# Patient Record
Sex: Male | Born: 1970
Health system: Southern US, Community
[De-identification: ages and names within clinical notes are randomized; demographics above are authoritative.]

## PROBLEM LIST (undated history)

## (undated) DIAGNOSIS — F32A Depression, unspecified: Secondary | ICD-10-CM

## (undated) DIAGNOSIS — F1910 Other psychoactive substance abuse, uncomplicated: Secondary | ICD-10-CM

## (undated) DIAGNOSIS — F329 Major depressive disorder, single episode, unspecified: Secondary | ICD-10-CM

## (undated) DIAGNOSIS — T7840XA Allergy, unspecified, initial encounter: Secondary | ICD-10-CM

## (undated) DIAGNOSIS — F419 Anxiety disorder, unspecified: Secondary | ICD-10-CM

## (undated) DIAGNOSIS — Z87442 Personal history of urinary calculi: Secondary | ICD-10-CM

## (undated) DIAGNOSIS — I10 Essential (primary) hypertension: Secondary | ICD-10-CM

## (undated) DIAGNOSIS — K219 Gastro-esophageal reflux disease without esophagitis: Secondary | ICD-10-CM

## (undated) DIAGNOSIS — D649 Anemia, unspecified: Secondary | ICD-10-CM

## (undated) HISTORY — DX: Major depressive disorder, single episode, unspecified: F32.9

## (undated) HISTORY — DX: Allergy, unspecified, initial encounter: T78.40XA

## (undated) HISTORY — DX: Essential (primary) hypertension: I10

## (undated) HISTORY — DX: Other psychoactive substance abuse, uncomplicated: F19.10

## (undated) HISTORY — DX: Anxiety disorder, unspecified: F41.9

## (undated) HISTORY — DX: Depression, unspecified: F32.A

---

## 2013-09-08 ENCOUNTER — Ambulatory Visit (INDEPENDENT_AMBULATORY_CARE_PROVIDER_SITE_OTHER): Payer: BC Managed Care – PPO | Admitting: Internal Medicine

## 2013-09-08 VITALS — BP 132/84 | HR 77 | Temp 97.9°F | Resp 18 | Ht 71.5 in | Wt 184.0 lb

## 2013-09-08 DIAGNOSIS — F17200 Nicotine dependence, unspecified, uncomplicated: Secondary | ICD-10-CM

## 2013-09-08 DIAGNOSIS — S0002XA Blister (nonthermal) of scalp, initial encounter: Secondary | ICD-10-CM

## 2013-09-08 DIAGNOSIS — G4700 Insomnia, unspecified: Secondary | ICD-10-CM

## 2013-09-08 DIAGNOSIS — S00521A Blister (nonthermal) of lip, initial encounter: Secondary | ICD-10-CM

## 2013-09-08 DIAGNOSIS — F32A Depression, unspecified: Secondary | ICD-10-CM

## 2013-09-08 DIAGNOSIS — F411 Generalized anxiety disorder: Secondary | ICD-10-CM

## 2013-09-08 DIAGNOSIS — S0082XA Blister (nonthermal) of other part of head, initial encounter: Secondary | ICD-10-CM

## 2013-09-08 DIAGNOSIS — S1092XA Blister (nonthermal) of unspecified part of neck, initial encounter: Secondary | ICD-10-CM

## 2013-09-08 DIAGNOSIS — F329 Major depressive disorder, single episode, unspecified: Secondary | ICD-10-CM

## 2013-09-08 DIAGNOSIS — F3289 Other specified depressive episodes: Secondary | ICD-10-CM

## 2013-09-08 DIAGNOSIS — K130 Diseases of lips: Secondary | ICD-10-CM

## 2013-09-08 DIAGNOSIS — F419 Anxiety disorder, unspecified: Secondary | ICD-10-CM

## 2013-09-08 MED ORDER — VALACYCLOVIR HCL 1 G PO TABS: 1000.0000 mg | ORAL_TABLET | Freq: Two times a day (BID) | ORAL | Status: DC

## 2013-09-08 MED ORDER — TRAZODONE HCL 50 MG PO TABS: 50.0000 mg | ORAL_TABLET | Freq: Every evening | ORAL | Status: DC | PRN

## 2013-09-08 MED ORDER — FLUOXETINE HCL 20 MG PO TABS: 20.0000 mg | ORAL_TABLET | Freq: Every day | ORAL | Status: DC

## 2013-09-08 NOTE — Progress Notes (Signed)
   Subjective:    Patient ID: Kyle Allen, male    DOB: 07/12/1970, 43 y.o.   MRN: [id number]  HPI    Review of Systems     Objective:   Physical Exam        Assessment & Plan:

## 2013-09-08 NOTE — Patient Instructions (Addendum)
Smoking Cessation Quitting smoking is important to your health and has many advantages. However, it is not always easy to quit since nicotine is a very addictive drug. Oftentimes, people try 3 times or more before being able to quit. This document explains the best ways for you to prepare to quit smoking. Quitting takes hard work and a lot of effort, but you can do it. ADVANTAGES OF QUITTING SMOKING  You will live longer, feel better, and live better.  Your body will feel the impact of quitting smoking almost immediately.  Within 20 minutes, blood pressure decreases. Your pulse returns to its normal level.  After 8 hours, carbon monoxide levels in the blood return to normal. Your oxygen level increases.  After 24 hours, the chance of having a heart attack starts to decrease. Your breath, hair, and body stop smelling like smoke.  After 48 hours, damaged nerve endings begin to recover. Your sense of taste and smell improve.  After 72 hours, the body is virtually free of nicotine. Your bronchial tubes relax and breathing becomes easier.  After 2 to 12 weeks, lungs can hold more air. Exercise becomes easier and circulation improves.  The risk of having a heart attack, stroke, cancer, or lung disease is greatly reduced.  After 1 year, the risk of coronary heart disease is cut in half.  After 5 years, the risk of stroke falls to the same as a nonsmoker.  After 10 years, the risk of lung cancer is cut in half and the risk of other cancers decreases significantly.  After 15 years, the risk of coronary heart disease drops, usually to the level of a nonsmoker.  If you are pregnant, quitting smoking will improve your chances of having a healthy baby.  The people you live with, especially any children, will be healthier.  You will have extra money to spend on things other than cigarettes. QUESTIONS TO THINK ABOUT BEFORE ATTEMPTING TO QUIT You may want to talk about your answers with your  health care provider.  Why do you want to quit?  If you tried to quit in the past, what helped and what did not?  What will be the most difficult situations for you after you quit? How will you plan to handle them?  Who can help you through the tough times? Your family? Friends? A health care provider?  What pleasures do you get from smoking? What ways can you still get pleasure if you quit? Here are some questions to ask your health care provider:  How can you help me to be successful at quitting?  What medicine do you think would be best for me and how should I take it?  What should I do if I need more help?  What is smoking withdrawal like? How can I get information on withdrawal? GET READY  Set a quit date.  Change your environment by getting rid of all cigarettes, ashtrays, matches, and lighters in your home, car, or work. Do not let people smoke in your home.  Review your past attempts to quit. Think about what worked and what did not. GET SUPPORT AND ENCOURAGEMENT You have a better chance of being successful if you have help. You can get support in many ways.  Tell your family, friends, and coworkers that you are going to quit and need their support. Ask them not to smoke around you.  Get individual, group, or telephone counseling and support. Programs are available at local hospitals and health centers. Call   your local health department for information about programs in your area.  Spiritual beliefs and practices may help some smokers quit.  Download a "quit meter" on your computer to keep track of quit statistics, such as how long you have gone without smoking, cigarettes not smoked, and money saved.  Get a self-help book about quitting smoking and staying off tobacco. LEARN NEW SKILLS AND BEHAVIORS  Distract yourself from urges to smoke. Talk to someone, go for a walk, or occupy your time with a task.  Change your normal routine. Take a different route to work.  Drink tea instead of coffee. Eat breakfast in a different place.  Reduce your stress. Take a hot bath, exercise, or read a book.  Plan something enjoyable to do every day. Reward yourself for not smoking.  Explore interactive web-based programs that specialize in helping you quit. GET MEDICINE AND USE IT CORRECTLY Medicines can help you stop smoking and decrease the urge to smoke. Combining medicine with the above behavioral methods and support can greatly increase your chances of successfully quitting smoking.  Nicotine replacement therapy helps deliver nicotine to your body without the negative effects and risks of smoking. Nicotine replacement therapy includes nicotine gum, lozenges, inhalers, nasal sprays, and skin patches. Some may be available over-the-counter and others require a prescription.  Antidepressant medicine helps people abstain from smoking, but how this works is unknown. This medicine is available by prescription.  Nicotinic receptor partial agonist medicine simulates the effect of nicotine in your brain. This medicine is available by prescription. Ask your health care provider for advice about which medicines to use and how to use them based on your health history. Your health care provider will tell you what side effects to look out for if you choose to be on a medicine or therapy. Carefully read the information on the package. Do not use any other product containing nicotine while using a nicotine replacement product.  RELAPSE OR DIFFICULT SITUATIONS Most relapses occur within the first 3 months after quitting. Do not be discouraged if you start smoking again. Remember, most people try several times before finally quitting. You may have symptoms of withdrawal because your body is used to nicotine. You may crave cigarettes, be irritable, feel very hungry, cough often, get headaches, or have difficulty concentrating. The withdrawal symptoms are only temporary. They are strongest  when you first quit, but they will go away within 10-14 days. To reduce the chances of relapse, try to:  Avoid drinking alcohol. Drinking lowers your chances of successfully quitting.  Reduce the amount of caffeine you consume. Once you quit smoking, the amount of caffeine in your body increases and can give you symptoms, such as a rapid heartbeat, sweating, and anxiety.  Avoid smokers because they can make you want to smoke.  Do not let weight gain distract you. Many smokers will gain weight when they quit, usually less than 10 pounds. Eat a healthy diet and stay active. You can always lose the weight gained after you quit.  Find ways to improve your mood other than smoking. FOR MORE INFORMATION  www.smokefree.gov  Document Released: 12/12/2000 Document Revised: 05/04/2013 Document Reviewed: 03/29/2011 [facility] Patient Information 2015 [place], [place]. This information is not intended to replace advice given to you by your health care provider. Make sure you discuss any questions you have with your health care provider. Cold Sore A cold sore (fever blister) is a skin infection caused by the herpes simplex virus (HSV-1). HSV-1 is closely related  to the virus that causes genital herpes (HSV-2), but they are not the same even though both viruses can cause oral and genital infections. Cold sores are small, fluid-filled sores inside of the mouth or on the lips, gums, nose, chin, cheeks, or fingers.  The herpes simplex virus can be easily passed (contagious) to other people through close personal contact, such as kissing or sharing personal items. The virus can also spread to other parts of the body, such as the eyes or genitals. Cold sores are contagious until the sores crust over completely. They often heal within 2 weeks.  Once a person is infected, the herpes simplex virus remains permanently in the body. Therefore, there is no cure for cold sores, and they often recur when a person is tired,  stressed, sick, or gets too much sun. Additional factors that can cause a recurrence include hormone changes in menstruation or pregnancy, certain drugs, and cold weather.  CAUSES  Cold sores are caused by the herpes simplex virus. The virus is spread from person to person through close contact, such as through kissing, touching the affected area, or sharing personal items such as lip balm, razors, or eating utensils.  SYMPTOMS  The first infection may not cause symptoms. If symptoms develop, the symptoms often go through different stages. Here is how a cold sore develops:   Tingling, itching, or burning is felt 1-2 days before the outbreak.   Fluid-filled blisters appear on the lips, inside the mouth, nose, or on the cheeks.   The blisters start to ooze clear fluid.   The blisters dry up and a yellow crust appears in its place.   The crust falls off.  Symptoms depend on whether it is the initial outbreak or a recurrence. Some other symptoms with the first outbreak may include:   Fever.   Sore throat.   Headache.   Muscle aches.   Swollen neck glands.  DIAGNOSIS  A diagnosis is often made based on your symptoms and looking at the sores. Sometimes, a sore may be swabbed and then examined in the lab to make a final diagnosis. If the sores are not present, blood tests can find the herpes simplex virus.  TREATMENT  There is no cure for cold sores and no vaccine for the herpes simplex virus. Within 2 weeks, most cold sores go away on their own without treatment. Medicines cannot make the infection go away, but medicine can help relieve some of the pain associated with the sores, can work to stop the virus from multiplying, and can also shorten healing time. Medicine may be in the form of creams, gels, pills, or a shot.  HOME CARE INSTRUCTIONS   Only take over-the-counter or prescription medicines for pain, discomfort, or fever as directed by your caregiver. Do not use aspirin.    Use a cotton-tip swab to apply creams or gels to your sores.   Do not touch the sores or pick the scabs. Wash your hands often. Do not touch your eyes without washing your hands first.   Avoid kissing, oral sex, and sharing personal items until sores heal.   Apply an ice pack on your sores for 10-15 minutes to ease any discomfort.   Avoid hot, cold, or salty foods because they may hurt your mouth. Eat a soft, bland diet to avoid irritating the sores. Use a straw to drink if you have pain when drinking out of a glass.   Keep sores clean and dry to prevent an infection  of other tissues.   Avoid the sun and limit stress if these things trigger outbreaks. If sun causes cold sores, apply sunscreen on the lips before being out in the sun.  SEEK MEDICAL CARE IF:   You have a fever or persistent symptoms for more than 2-3 days.   You have a fever and your symptoms suddenly get worse.   You have pus, not clear fluid, coming from the sores.   You have redness that is spreading.   You have pain or irritation in your eye.   You get sores on your genitals.   Your sores do not heal within 2 weeks.   You have a weakened immune system.   You have frequent recurrences of cold sores.  MAKE SURE YOU:   Understand these instructions.  Will watch your condition.  Will get help right away if you are not doing well or get worse. Document Released: 12/16/1999 Document Revised: 05/04/2013 Document Reviewed: 05/02/2011 [facility] Patient Information 2015 [place], [place]. This information is not intended to replace advice given to you by your health care provider. Make sure you discuss any questions you have with your health care provider.

## 2013-09-08 NOTE — Progress Notes (Signed)
   Subjective:    Patient ID: Kyle Allen, male    DOB: 11-Apr-1970, 44 y.o.   MRN: [id number]  HPI Kyle Allen is a 43 y.o. Male patient for medication refills and for an exam for a blister on his lower lip. He needs refills for the Prozac  and Trazodone . He currently takes Prozac for depression and anxiety. He also takes Trazodone for insomnia. In April, he was at [facility] for treatment for alcoholism and drug dependence. He has been sober and clean for the past 5 months. The mentioned medications above were given to him while he was in the treatment center.   He is also here for a blister on his lower lip. He states that that blister hurts the most at night. He does not get any relief from different lip balms. He thinks it may be a sunburn. He is  A smoker and the bister present for one month. No past dx of fever blister but gets this blister every summer when in the sun.   [facility].    Review of Systems     Objective:   Physical Exam  Vitals reviewed. Constitutional: He is oriented to person, place, and time. He appears well-developed and well-nourished. No distress.  HENT:  Head: Normocephalic and atraumatic.  Mouth/Throat: Oral lesions present. No uvula swelling.    2 intact blisters Will attempt culture.  Eyes: EOM are normal.  Neck: Normal range of motion.  Cardiovascular: Normal rate, regular rhythm and normal heart sounds.   Pulmonary/Chest: Effort normal and breath sounds normal.  Neurological: He is alert and oriented to person, place, and time. He exhibits normal muscle tone. Coordination normal.  Skin: Rash noted.  Psychiatric: He has a normal mood and affect. His speech is normal and behavior is normal. Judgment and thought content normal. Cognition and memory are normal.          Assessment & Plan:  Depression/anxiety/recovering substance abuse Refill prozac and trazadone Culture blister/there is a concern  this could be skin cancer. Valcyclovir trial

## 2013-09-11 LAB — HERPES SIMPLEX VIRUS CULTURE: ORGANISM ID, BACTERIA: NOT DETECTED

## 2013-09-14 ENCOUNTER — Encounter: Payer: Self-pay | Admitting: Radiology

## 2013-09-16 ENCOUNTER — Encounter: Payer: Self-pay | Admitting: Family Medicine

## 2013-09-16 ENCOUNTER — Ambulatory Visit (INDEPENDENT_AMBULATORY_CARE_PROVIDER_SITE_OTHER): Payer: BC Managed Care – PPO | Admitting: Family Medicine

## 2013-09-16 VITALS — BP 134/76 | HR 81 | Temp 98.9°F | Resp 16 | Ht 72.0 in | Wt 183.2 lb

## 2013-09-16 DIAGNOSIS — F1911 Other psychoactive substance abuse, in remission: Secondary | ICD-10-CM

## 2013-09-16 DIAGNOSIS — F329 Major depressive disorder, single episode, unspecified: Secondary | ICD-10-CM

## 2013-09-16 DIAGNOSIS — F32A Depression, unspecified: Secondary | ICD-10-CM

## 2013-09-16 DIAGNOSIS — F341 Dysthymic disorder: Secondary | ICD-10-CM

## 2013-09-16 DIAGNOSIS — B001 Herpesviral vesicular dermatitis: Secondary | ICD-10-CM

## 2013-09-16 DIAGNOSIS — B009 Herpesviral infection, unspecified: Secondary | ICD-10-CM

## 2013-09-16 DIAGNOSIS — F419 Anxiety disorder, unspecified: Principal | ICD-10-CM

## 2013-09-16 DIAGNOSIS — Z720 Tobacco use: Secondary | ICD-10-CM

## 2013-09-16 DIAGNOSIS — Z23 Encounter for immunization: Secondary | ICD-10-CM

## 2013-09-16 DIAGNOSIS — G4700 Insomnia, unspecified: Secondary | ICD-10-CM

## 2013-09-16 DIAGNOSIS — F17200 Nicotine dependence, unspecified, uncomplicated: Secondary | ICD-10-CM

## 2013-09-16 NOTE — Patient Instructions (Signed)
1. Please call me in 2 weeks if your lip has not completely healed.

## 2013-09-16 NOTE — Progress Notes (Signed)
Subjective:    Patient ID: Kyle Allen, male    DOB: 12/09/1970, 43 y.o.   MRN: [id number]  This chart was scribed for Ethelda Chick, MD by Tonye Royalty, ED Scribe. This patient was seen in room 21 and the patient's care was started at 11:08 AM.   09/16/2013  Establish Care, Anxiety, Addiction Problem and Insomnia   HPI  HPI Comments: Kyle Allen is a 43 y.o. male who presents to [facility] to become established as a patient. He saw Dr. Perrin Maltese last week for refill of his antidepressants. He is maintained on Prozac  daily for anxiety and depression, he also takes Trazodone for insomnia. He has a history of polysubstance abuse and was in [facility] in April through July and has been sober since then.  He also saw Dr. Perrin Maltese for a blister on his lip that had been present for 1 month, he did a herpes culture that was negative. He had a cholesterol test done on 04/03/2013 that measured 228 total cholesterol, LDL 117, and HDL 86. Dr. Perrin Maltese prescribed Valtrex for his lip ulcer. He was recommended to get in to live at the [facility] after discharge from [facility].  He states his blister is much improved at this time but states he still wakes up with blood on his lip. He states he stopped applying chap stick or other products to the area and reports taking Valtrex for 1 week.Marland Kitchen He states he has a blister to that area every summer but states it has not lasted as long previously.    He states he did the 90 day program at [facility], having moved to [place] from [place] for this purpose, and now lives at [facility].   He states his last physical was in 2009 or 2010, last tetanus is unknown, last eye exam was some time ago but uses a pair of reading glasses from [employer], and last saw the dentist in April.   He reports seasonal allergies and history of anxiety and depression. He states daily Prozac makes him feel better though he knows it is a low dose.  He reports smoking one pack a day. He denies other significant medical history, past surgeries, or hospitalization for reasons unrelated polysubstance abuse.   He states his mother is 56 with hypertension and cholesterol without heart attack, stroke, or alcohol abuse; his father is 108 without history of alcohol abuse; he reports his grandparents had history of alcohol abuse. He states he is single and has never been married or had children. 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 He states he did drink a fifth a day for over 20 years; he reports 5 DWIs but states he can get his license back September 23. He reports previously abusing marijuana and prescription pain medications but states alcohol was his main addiction. He states he goes to the gym 3 times a week and does cardio and weight lifting; he states he has gained significant weight since going to [facility].   After getting out of [facility] he was referred to psychiatry and saw Dr. Tomasa Rand who prescribed him with bipolar disorder and recommended many anti-psychotic medications. He states he meets with an extended recovery group from [facility] and will for 12 months; he states his counselors did not approve of the psychiatrist's recommendations and he does not plan to see him again. He states his  anxiety is under control.   He denies chest pain, shortness of breath, GI problems (he states he takes Prilosec every day), or black or bloody stools.   Review of Systems  Constitutional: Negative for fever, chills, diaphoresis, activity change, appetite change and fatigue.  Eyes: Negative for visual disturbance.  Respiratory: Negative for cough and shortness of breath.   Cardiovascular: Negative for chest pain, palpitations and leg swelling.  Gastrointestinal: Negative for nausea, vomiting, abdominal pain and blood in stool.  Endocrine: Negative for cold intolerance, heat  intolerance, polydipsia, polyphagia and polyuria.  Skin: Positive for wound.  Neurological: Negative for dizziness, tremors, seizures, syncope, facial asymmetry, speech difficulty, weakness, light-headedness, numbness and headaches.  Psychiatric/Behavioral: Negative for suicidal ideas, sleep disturbance, self-injury and dysphoric mood. The patient is not nervous/anxious (states he is better than he has been in a long time).        States he is emotionally well    Past Medical History  Diagnosis Date  . Allergy   . Anxiety   . Depression   . Substance abuse    No past surgical history on file. No Known Allergies Current Outpatient Prescriptions  Medication Sig Dispense Refill  . FLUoxetine (PROZAC) 20 MG tablet Take 1 tablet (20 mg total) by mouth daily.  30 tablet  3  . traZODone (DESYREL) 50 MG tablet Take 1 tablet (50 mg total) by mouth at bedtime as needed for sleep.  30 tablet  3  . valACYclovir (VALTREX) 1000 MG tablet Take 1 tablet (1,000 mg total) by mouth 2 (two) times daily.  14 tablet  1   No current facility-administered medications for this visit.   History   Social History  . Marital Status: Single    Spouse Name: N/A    Number of Children: N/A  . Years of Education: N/A   Occupational History  . Not on file.   Social History Main Topics  . Smoking status: Current Every Day Smoker -- 1.00 packs/day for 20 years    Types: Cigarettes  . Smokeless tobacco: Not on file  . Alcohol Use: No  . Drug Use: No  . Sexual Activity: Not on file   Other Topics Concern  . Not on file   Social History Narrative   Marital status: single; not dating.  From [place], [place].       Children:  None       Lives: at [facility] since 07/2013.  Group home of 8 men in recovery.      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      Tobacco:  1 ppd x 20 years.  Never quit.      Alcohol: in recovery; drinking 1/5 per day;  duration 20 years; DWIs x 5; last DWI 09/2009.  Gets license back this month; four years without license.      Drugs:  Marijuana, prescription pills (pain medications Oxycontin, Vicodin, some benzos).  No heroine or cocaine.      Exercise:  Six months; joined [employer]; cardio to start; free weights   Family History  Problem Relation Age of Onset  . Hyperlipidemia Mother   . Hypertension Mother   . Hyperlipidemia Father   . Hypertension Father   . Hyperlipidemia Brother   . Hypertension Brother   . Cancer Maternal Grandmother   . Hyperlipidemia Maternal Grandmother   . Hypertension Maternal Grandmother   . Hyperlipidemia Maternal Grandfather   . Hypertension Maternal Grandfather   .  Diabetes Maternal Grandfather   . Hyperlipidemia Paternal Grandmother   . Hypertension Paternal Grandmother   . Hyperlipidemia Paternal Grandfather   . Hypertension Paternal Grandfather   . Heart disease Paternal Grandfather        Objective:    BP 134/76  Pulse 81  Temp(Src) 98.9 F (37.2 C) (Oral)  Resp 16  Ht 6' (1.829 m)  Wt 183 lb 3.2 oz (83.099 kg)  BMI 24.84 kg/m2  SpO2 97% Physical Exam  Nursing note and vitals reviewed. Constitutional: He is oriented to person, place, and time. He appears well-developed and well-nourished. No distress.  HENT:  Head: Normocephalic and atraumatic.  Right Ear: External ear normal.  Left Ear: External ear normal.  Nose: Nose normal.  Mouth/Throat: Oropharynx is clear and moist.    Healing eschar on lower lip  Eyes: Conjunctivae and EOM are normal. Pupils are equal, round, and reactive to light.  Neck: Normal range of motion. Neck supple. Carotid bruit is not present. No thyromegaly present.  Cardiovascular: Normal rate, regular rhythm, normal heart sounds and intact distal pulses.  Exam reveals no gallop and no friction rub.   No murmur heard. Pulmonary/Chest: Effort normal and breath sounds normal. No respiratory distress. He has no wheezes.  He has no rales.  Abdominal: Soft. Bowel sounds are normal. He exhibits no distension and no mass. There is no tenderness. There is no rebound and no guarding.  Musculoskeletal: Normal range of motion.  Lymphadenopathy:    He has no cervical adenopathy.  Neurological: He is alert and oriented to person, place, and time. No cranial nerve deficit. He exhibits normal muscle tone. Coordination normal.  Normal strength to his arms  Skin: Skin is warm and dry. No rash noted. He is not diaphoretic.  Psychiatric: He has a normal mood and affect. His behavior is normal. Judgment and thought content normal.   Results for orders placed in visit on 09/08/13  HERPES SIMPLEX VIRUS CULTURE      Result Value Ref Range   Organism ID, Bacteria No Herpes Simplex Virus detected.         Assessment & Plan:   1. Anxiety and depression   2. Insomnia   3. Tobacco abuse   4. Substance abuse in remission   5. Need for prophylactic vaccination and inoculation against influenza   6. Need for prophylactic vaccination with combined diphtheria-tetanus-pertussis (DTP) vaccine   7. Herpes labialis     1. Anxiety and depression: controlled with Prozac  daily; follow up in three months due to recent transition from [facility] to [facility].  Doing well currently; continue weekly group counseling at [facility]. 2.  Insomnia: controlled with Trazodone; refill provided. 3.  Tobacco abuse: persistent; agree with focusing on cessation in upcoming year. 4.  Substance abuse in remission: Stable; alcohol and narcotic prescription drugs; DWIs x 5 in past; sober for six months; to [employer] in October.  Attending weekly group sessions; attending AA meetings twice daily currently; applying for a job currently.  Family in [place] and advised NOT to return to [place] per  [facility]. 5.  S/p TDAP 6. S/p influenza vaccine. 7. Herpes Labialis: improving; s/p Valtrex for one week.  If wound not completely  healed in two weeks, call office for dermatology evaluation and bx.   No orders of the defined types were placed in this encounter.    Return in about 3 months (around 12/16/2013) for recheck.   I personally performed the services described  in this documentation, which was scribed in my presence.  The recorded information has been reviewed and is accurate.   Nilda Simmer, M.D.  [facility] [address] [place], [place]  [address] [phone] phone [phone] fax

## 2013-09-18 DIAGNOSIS — F17200 Nicotine dependence, unspecified, uncomplicated: Secondary | ICD-10-CM | POA: Insufficient documentation

## 2013-09-18 DIAGNOSIS — F32A Depression, unspecified: Secondary | ICD-10-CM | POA: Insufficient documentation

## 2013-09-18 DIAGNOSIS — G4700 Insomnia, unspecified: Secondary | ICD-10-CM | POA: Insufficient documentation

## 2013-09-18 DIAGNOSIS — F1911 Other psychoactive substance abuse, in remission: Secondary | ICD-10-CM | POA: Insufficient documentation

## 2013-09-18 DIAGNOSIS — F419 Anxiety disorder, unspecified: Principal | ICD-10-CM

## 2013-09-18 DIAGNOSIS — B001 Herpesviral vesicular dermatitis: Secondary | ICD-10-CM | POA: Insufficient documentation

## 2013-09-18 DIAGNOSIS — F329 Major depressive disorder, single episode, unspecified: Secondary | ICD-10-CM | POA: Insufficient documentation

## 2013-12-16 ENCOUNTER — Ambulatory Visit: Payer: BC Managed Care – PPO | Admitting: Family Medicine

## 2014-01-04 ENCOUNTER — Ambulatory Visit (INDEPENDENT_AMBULATORY_CARE_PROVIDER_SITE_OTHER): Payer: BLUE CROSS/BLUE SHIELD | Admitting: Family Medicine

## 2014-01-04 ENCOUNTER — Encounter: Payer: Self-pay | Admitting: Family Medicine

## 2014-01-04 VITALS — BP 144/94 | HR 88 | Temp 98.4°F | Resp 16 | Ht 71.5 in | Wt 184.2 lb

## 2014-01-04 DIAGNOSIS — G4700 Insomnia, unspecified: Secondary | ICD-10-CM | POA: Diagnosis not present

## 2014-01-04 DIAGNOSIS — S00521A Blister (nonthermal) of lip, initial encounter: Secondary | ICD-10-CM

## 2014-01-04 DIAGNOSIS — F419 Anxiety disorder, unspecified: Secondary | ICD-10-CM

## 2014-01-04 DIAGNOSIS — F418 Other specified anxiety disorders: Secondary | ICD-10-CM | POA: Diagnosis not present

## 2014-01-04 DIAGNOSIS — F32A Depression, unspecified: Secondary | ICD-10-CM

## 2014-01-04 DIAGNOSIS — Z720 Tobacco use: Secondary | ICD-10-CM

## 2014-01-04 DIAGNOSIS — F1910 Other psychoactive substance abuse, uncomplicated: Secondary | ICD-10-CM | POA: Diagnosis not present

## 2014-01-04 DIAGNOSIS — K130 Diseases of lips: Secondary | ICD-10-CM

## 2014-01-04 DIAGNOSIS — F329 Major depressive disorder, single episode, unspecified: Secondary | ICD-10-CM | POA: Diagnosis not present

## 2014-01-04 DIAGNOSIS — F17200 Nicotine dependence, unspecified, uncomplicated: Secondary | ICD-10-CM

## 2014-01-04 DIAGNOSIS — F1911 Other psychoactive substance abuse, in remission: Secondary | ICD-10-CM

## 2014-01-04 MED ORDER — FLUOXETINE HCL 20 MG PO TABS: 20.0000 mg | ORAL_TABLET | Freq: Every day | ORAL | Status: DC

## 2014-01-04 NOTE — Patient Instructions (Signed)
Congratulations on all the steps you've made!  We've refilled your prozac for 3 months. Please let us know if you feel like the anxiety is continuing to bother you over the course of this month and we will have you increase your dose to 40 mg every day. Otherwise we'll leave it as is.  We'll plan to see you back in 3 months.

## 2014-01-04 NOTE — Progress Notes (Signed)
Subjective:    Patient ID: Kyle Allen, male    DOB: 1970/10/25, 44 y.o.   MRN: [id number]  PCP: Reginia Forts, MD  Chief Complaint  Patient presents with  . Medication Refill    PROZAC and TRAZODONE   Patient Active Problem List   Diagnosis Date Noted  . Anxiety and depression 09/18/2013  . Insomnia 09/18/2013  . Tobacco abuse 09/18/2013  . Substance abuse in remission 09/18/2013  . Herpes labialis 09/18/2013   Prior to Admission medications   Medication Sig Start Date End Date Taking? Authorizing Provider  FLUoxetine (PROZAC) 20 MG tablet Take 1 tablet (20 mg total) by mouth daily. 09/08/13  Yes Orma Flaming, MD  traZODone (DESYREL) 50 MG tablet Take 1 tablet (50 mg total) by mouth at bedtime as needed for sleep. 09/08/13  Yes Orma Flaming, MD  valACYclovir (VALTREX) 1000 MG tablet Take 1 tablet (1,000 mg total) by mouth 2 (two) times daily. Patient not taking: Reported on 01/04/2014 09/08/13   Orma Flaming, MD   Medications, allergies, past medical history, surgical history, family history, social history and problem list reviewed and updated.  HPI  39 yom with pmh substance abuse, depression, anxiety presents for 3 month f/u visit.  Doing well since last visit. Has moved out of [facility] and into [facility]. He has his own room as of past couple months. He got his license back, bought a car, and has been working part-time in Theatre manager at [employer].   He went back to [facility] for the first time in 8 months over the holidays to see his family. Reall enjoyed his visit. He mentions he has been feeling slightly anxious the past five weeks. He attributes this to his trip to [place] and the possibility that he would see his old friends. He doesn't think the anxiety is affecting his work at all. His sponsor is aware of this and is not concerned. He continues to go to daily [employer]. Has been sober 9 months.   Taking his prozac 20 mg qd. Taking  trazodone 50 mg for sleep approx 3 nights per week. He has graduated from the weekly outpatient psychotherapy group sessions at [facility]; those weekly sessions ended in November 2015.  He talks to his sponsor daily and is working his steps.  Continues to suffer with frequent using dreams which are quite overwhelming and frightening to patient.   When he was seen here in 9/15 he had a blister on his lip, this resolved with valtrex and has not come back.   Review of Systems  Constitutional: Negative for fever, chills, diaphoresis and fatigue.  Skin: Negative for rash and wound.  Psychiatric/Behavioral: Positive for sleep disturbance. Negative for suicidal ideas, self-injury, dysphoric mood and decreased concentration. The patient is nervous/anxious.    No CP, SOB, fever, chills.     Objective:   Physical Exam  Constitutional: He is oriented to person, place, and time. He appears well-developed and well-nourished.  Non-toxic appearance. He does not have a sickly appearance. He does not appear ill. No distress.  BP 144/94 mmHg  Pulse 88  Temp(Src) 98.4 F (36.9 C) (Oral)  Resp 16  Ht 5' 11.5" (1.816 m)  Wt 184 lb 3.2 oz (83.553 kg)  BMI 25.34 kg/m2  SpO2 97%   HENT:  Head: Normocephalic and atraumatic.  Eyes: Conjunctivae and EOM are normal. Pupils are equal, round, and reactive to light.  Neck: Normal range of motion. Neck supple. No  thyromegaly present.  Cardiovascular: Normal rate, regular rhythm and normal heart sounds.   No murmur heard. Pulmonary/Chest: Breath sounds normal. He has no wheezes.  Lymphadenopathy:    He has no cervical adenopathy.  Neurological: He is alert and oriented to person, place, and time. No cranial nerve deficit. Coordination normal.  No tremor.  Skin: No rash noted. He is not diaphoretic.  Psychiatric: He has a normal mood and affect. His speech is normal and behavior is normal.      Assessment & Plan:   89 yom with pmh substance abuse,  depression, anxiety presents for 3 month f/u visit.  Anxiety and depression - Plan: FLUoxetine (PROZAC) 20 MG tablet --Doing well, in good spirits in clinic today --Continue prozac 20 mg qd --continue trazodone at night as needed --Has had slightly increased anxiety lately most likely due to recent holiday visit. If anxiety continues over this month can increase dose to 40 mg qd. Pt to call if desires this. --rtc 6 weeks for f/u -continues in remission/recovery; met nine month mark of recovery.    Julieta Gutting, PA-C Physician Assistant-Certified [facility]  Norwood Levo, M.D. [facility] [place] [address] [place], [place]  [address] [phone] phone [phone] fax   01/04/2014 2:40 PM

## 2014-01-25 ENCOUNTER — Telehealth: Payer: Self-pay

## 2014-01-25 DIAGNOSIS — F32A Depression, unspecified: Secondary | ICD-10-CM

## 2014-01-25 DIAGNOSIS — F419 Anxiety disorder, unspecified: Principal | ICD-10-CM

## 2014-01-25 DIAGNOSIS — F329 Major depressive disorder, single episode, unspecified: Secondary | ICD-10-CM

## 2014-01-25 MED ORDER — FLUOXETINE HCL 20 MG PO TABS: 40.0000 mg | ORAL_TABLET | Freq: Every day | ORAL | Status: DC

## 2014-01-25 NOTE — Telephone Encounter (Signed)
Sent script to pharmacy- Per OV pt was to increase to 40mg  daily.

## 2014-01-25 NOTE — Telephone Encounter (Signed)
Pt states Dr. Katrinka BlazingSmith wanted him to double up on his PROZAC 20mg s, and take 40mg s but he is totally out now had gone to the pharmacy but wasn't able to get refills unless the Dr. Call [employer] to authorize Getting a refill before February . Please call pt at [phone]   [facility] [address] [facility]

## 2014-01-26 ENCOUNTER — Telehealth: Payer: Self-pay

## 2014-01-26 NOTE — Telephone Encounter (Signed)
Pt states he is in dire need of his PROZAC 20mg s. Please call [phone]

## 2014-01-26 NOTE — Telephone Encounter (Signed)
Check w/pharm to make sure Rx went through w/out problem. Notified pt ready at pharm for p/up.

## 2014-02-15 ENCOUNTER — Ambulatory Visit (INDEPENDENT_AMBULATORY_CARE_PROVIDER_SITE_OTHER): Payer: BLUE CROSS/BLUE SHIELD | Admitting: Family Medicine

## 2014-02-15 ENCOUNTER — Encounter: Payer: Self-pay | Admitting: Family Medicine

## 2014-02-15 VITALS — BP 172/94 | HR 84 | Temp 98.8°F | Resp 16 | Ht 71.5 in | Wt 185.0 lb

## 2014-02-15 DIAGNOSIS — R030 Elevated blood-pressure reading, without diagnosis of hypertension: Secondary | ICD-10-CM

## 2014-02-15 DIAGNOSIS — F1910 Other psychoactive substance abuse, uncomplicated: Secondary | ICD-10-CM

## 2014-02-15 DIAGNOSIS — F1911 Other psychoactive substance abuse, in remission: Secondary | ICD-10-CM

## 2014-02-15 DIAGNOSIS — F411 Generalized anxiety disorder: Secondary | ICD-10-CM

## 2014-02-15 DIAGNOSIS — IMO0001 Reserved for inherently not codable concepts without codable children: Secondary | ICD-10-CM

## 2014-02-15 LAB — CBC WITH DIFFERENTIAL/PLATELET
Basophils Absolute: 0 10*3/uL (ref 0.0–0.1)
Basophils Relative: 0 % (ref 0–1)
Eosinophils Absolute: 0.4 10*3/uL (ref 0.0–0.7)
Eosinophils Relative: 4 % (ref 0–5)
HCT: 42.8 % (ref 39.0–52.0)
Hemoglobin: 15 g/dL (ref 13.0–17.0)
LYMPHS ABS: 2.9 10*3/uL (ref 0.7–4.0)
LYMPHS PCT: 28 % (ref 12–46)
MCH: 33.5 pg (ref 26.0–34.0)
MCHC: 35 g/dL (ref 30.0–36.0)
MCV: 95.5 fL (ref 78.0–100.0)
MONOS PCT: 6 % (ref 3–12)
MPV: 9 fL (ref 8.6–12.4)
Monocytes Absolute: 0.6 10*3/uL (ref 0.1–1.0)
NEUTROS ABS: 6.5 10*3/uL (ref 1.7–7.7)
Neutrophils Relative %: 62 % (ref 43–77)
Platelets: 287 10*3/uL (ref 150–400)
RBC: 4.48 MIL/uL (ref 4.22–5.81)
RDW: 12.8 % (ref 11.5–15.5)
WBC: 10.5 10*3/uL (ref 4.0–10.5)

## 2014-02-15 LAB — POCT URINALYSIS DIPSTICK
Bilirubin, UA: NEGATIVE
Blood, UA: NEGATIVE
Glucose, UA: NEGATIVE
Ketones, UA: NEGATIVE
Leukocytes, UA: NEGATIVE
Nitrite, UA: NEGATIVE
PH UA: 5.5
Protein, UA: NEGATIVE
Spec Grav, UA: 1.01
UROBILINOGEN UA: 0.2

## 2014-02-15 LAB — TSH: TSH: 1.46 u[IU]/mL (ref 0.350–4.500)

## 2014-02-15 LAB — COMPREHENSIVE METABOLIC PANEL
ALBUMIN: 4.9 g/dL (ref 3.5–5.2)
ALK PHOS: 36 U/L — AB (ref 39–117)
ALT: 16 U/L (ref 0–53)
AST: 16 U/L (ref 0–37)
BUN: 14 mg/dL (ref 6–23)
CALCIUM: 9.9 mg/dL (ref 8.4–10.5)
CO2: 26 meq/L (ref 19–32)
Chloride: 102 mEq/L (ref 96–112)
Creat: 0.97 mg/dL (ref 0.50–1.35)
GLUCOSE: 90 mg/dL (ref 70–99)
POTASSIUM: 4.5 meq/L (ref 3.5–5.3)
Sodium: 136 mEq/L (ref 135–145)
TOTAL PROTEIN: 7.2 g/dL (ref 6.0–8.3)
Total Bilirubin: 0.4 mg/dL (ref 0.2–1.2)

## 2014-02-15 MED ORDER — PROPRANOLOL HCL ER 80 MG PO CP24: 80.0000 mg | ORAL_CAPSULE | Freq: Every day | ORAL | Status: DC

## 2014-02-15 MED ORDER — SERTRALINE HCL 50 MG PO TABS: 50.0000 mg | ORAL_TABLET | Freq: Every day | ORAL | Status: DC

## 2014-02-15 NOTE — Progress Notes (Signed)
[facility] [address], [place] [id number] [phone]  Date:  02/15/2014   Name:  Kyle Allen   DOB:  01/19/1970   MRN:  [id number]  PCP:  Kyle Allen,Kyle Stachnik, MD    Chief Complaint: follow up anxiety and bp   History of Present Illness:  Kyle Allen is a 44 y.o. very pleasant male patient who presents for six week follow up of anxiety and elevated BP.  Patient states that things are going ok.  He has doubled the Prozac to 40 mg qd for the last 4 weeks.  However he has not felt a difference with his anxiety.  He states that it is constant, and does not site any triggers to these symptoms.  He has maintained his sobriety for 10 months, now and denies any use of drug or alcohol.  He has challenges with this, but states that he does not have severe thoughts of relapsing.  [place], with a [place], and feels very supported with this.  He states he feels nervous most of the time.  He denies any chest pains or palpitations, but states that he can feel his heart beating.  He also denies any leg swelling.  He is sleeping well.  Exercising at least 3x/week.  He has maintained employment with [employer], and reports no additional stressors with his work load, which is why this "unexplained" anxiety is concerning for him.  He is a smoker reporting 1pk/day.  He intends to quit smoking at his 1 year anniversary of sobriety.  He drinks 3 mountain dews per day.  He also denies SOB, PND, dizziness, change in vision, or headaches.     Patient Active Problem List   Diagnosis Date Noted  . Anxiety and depression 09/18/2013  . Insomnia 09/18/2013  . Tobacco abuse 09/18/2013  . Substance abuse in remission 09/18/2013  . Herpes labialis 09/18/2013    Past Medical History  Diagnosis Date  . Allergy   . Anxiety   . Depression   . Substance abuse     No past surgical history on file.  History  Substance Use Topics  . Smoking status: Current Every Day Smoker -- 1.00  packs/day for 20 years    Types: Cigarettes  . Smokeless tobacco: Not on file  . Alcohol Use: No    Family History  Problem Relation Age of Onset  . Hyperlipidemia Mother   . Hypertension Mother   . Hyperlipidemia Father   . Hypertension Father   . Hyperlipidemia Brother   . Hypertension Brother   . Cancer Maternal Grandmother   . Hyperlipidemia Maternal Grandmother   . Hypertension Maternal Grandmother   . Hyperlipidemia Maternal Grandfather   . Hypertension Maternal Grandfather   . Diabetes Maternal Grandfather   . Hyperlipidemia Paternal Grandmother   . Hypertension Paternal Grandmother   . Hyperlipidemia Paternal Grandfather   . Hypertension Paternal Grandfather   . Heart disease Paternal Grandfather     No Known Allergies  Medication list has been reviewed and updated.  Current Outpatient Prescriptions on File Prior to Visit  Medication Sig Dispense Refill  . FLUoxetine (PROZAC) 20 MG tablet Take 2 tablets (40 mg total) by mouth daily. 60 tablet 2  . traZODone (DESYREL) 50 MG tablet Take 1 tablet (50 mg total) by mouth at bedtime as needed for sleep. 30 tablet 3  . valACYclovir (VALTREX) 1000 MG tablet Take 1 tablet (1,000 mg total) by mouth 2 (two) times daily. (Patient not  taking: Reported on 01/04/2014) 14 tablet 1   No current facility-administered medications on file prior to visit.    Review of Systems: ROS otherwise unremarkable unless mentioned above.  Physical Examination: Filed Vitals:   02/15/14 1357  BP: 172/94  Pulse: 84  Temp: 98.8 F (37.1 C)  Resp: 16   Filed Vitals:   02/15/14 1357  Height: 5' 11.5" (1.816 m)  Weight: 185 lb (83.915 kg)   Body mass index is 25.45 kg/(m^2). Ideal Body Weight: Weight in (lb) to have BMI = 25: 181.4  Physical Exam  Constitutional: He appears well-developed and well-nourished. He is cooperative.  HENT:  Head: Normocephalic and atraumatic.  Eyes: EOM are normal. Pupils are equal, round, and reactive to  light.  Cardiovascular: Normal rate and regular rhythm.  Exam reveals no gallop and no friction rub.   No murmur heard. Pulses:      Dorsalis pedis pulses are 2+ on the right side, and 2+ on the left side.  RRR without gallop, however strong, loud, intensity at the S1 and S2 without murmur.  No carotid bruit.    Pulmonary/Chest: Effort normal and breath sounds normal. No respiratory distress. He has no wheezes.  Musculoskeletal: He exhibits no edema.  Neurological: He is alert.  Skin: Skin is warm and dry.  Psychiatric: He has a normal mood and affect. His behavior is normal.     EKG read by Dr. Katrinka Allen: NSR; no ST changes.    Assessment and Plan: 44 year old male is here today for medication follow up and increased anxiety.  Blood pressure elevated -Worsening.  - Plan: POCT urinalysis dipstick, CBC with Differential/Platelet, Comprehensive metabolic panel, TSH, propranolol ER (INDERAL LA) 80 MG 24 hr capsule, EKG 12-Lead, CANCELED: COMPLETE METABOLIC PANEL WITH GFR -Checking kidney and liver function -Ordered TSH to assess whether thyroid dysfunction is culprit, given current hx of HTN coupled with increased anxiety -UA  -works at a nursing home.  He will recheck his BP 2x/week and additionally with sxs and record for follow up.    Generalized anxiety disorder -Persistent/unchanged despite increase in Prozac. - Plan: TSH -Changing to Zoloft to decrease the anxiety component.  He will decrease Prozac to 20 mg to taper off.  We will recheck in 6 weeks.   -Deceasing caffeine intake to 2 daily -Starting propanol LA 80 mg qd  Substance abuse in remission -Stable; maintaining sobriety despite anxiety. -Continue AA and talking regularly with sponsor.    Kyle Allen, M.D. [facility] [address] [place], [place]  [address] [phone] phone [phone] fax

## 2014-02-15 NOTE — Patient Instructions (Addendum)
1.  DECREASE PROZAC/FLUOXETINE 20MG  TO ONE TABLET DAILY FOR NEXT MONTH. 2.  START ZOLOFT/SERTRALINE 50MG  ONE TABLET DAILY NOW. 3.  START PROPANALOL/INDERAL LA 80MG  ONE TABLET DAILY. 4.  DECREASE MOUNTAIN DEW TO TWO DAILY.  Generalized Anxiety Disorder Generalized anxiety disorder (GAD) is a mental disorder. It interferes with life functions, including relationships, work, and school. GAD is different from normal anxiety, which everyone experiences at some point in their lives in response to specific life events and activities. Normal anxiety actually helps us prepare for and get through these life events and activities. Normal anxiety goes away after the event or activity is over.  GAD causes anxiety that is not necessarily related to specific events or activities. It also causes excess anxiety in proportion to specific events or activities. The anxiety associated with GAD is also difficult to control. GAD can vary from mild to severe. People with severe GAD can have intense waves of anxiety with physical symptoms (panic attacks).  SYMPTOMS The anxiety and worry associated with GAD are difficult to control. This anxiety and worry are related to many life events and activities and also occur more days than not for 6 months or longer. People with GAD also have three or more of the following symptoms (one or more in children):  Restlessness.   Fatigue.  Difficulty concentrating.   Irritability.  Muscle tension.  Difficulty sleeping or unsatisfying sleep. DIAGNOSIS GAD is diagnosed through an assessment by your health care provider. Your health care provider will ask you questions aboutyour mood,physical symptoms, and events in your life. Your health care provider may ask you about your medical history and use of alcohol or drugs, including prescription medicines. Your health care provider may also do a physical exam and blood tests. Certain medical conditions and the use of certain  substances can cause symptoms similar to those associated with GAD. Your health care provider may refer you to a mental health specialist for further evaluation. TREATMENT The following therapies are usually used to treat GAD:   Medication. Antidepressant medication usually is prescribed for long-term daily control. Antianxiety medicines may be added in severe cases, especially when panic attacks occur.   Talk therapy (psychotherapy). Certain types of talk therapy can be helpful in treating GAD by providing support, education, and guidance. A form of talk therapy called cognitive behavioral therapy can teach you healthy ways to think about and react to daily life events and activities.  Stress managementtechniques. These include yoga, meditation, and exercise and can be very helpful when they are practiced regularly. A mental health specialist can help determine which treatment is best for you. Some people see improvement with one therapy. However, other people require a combination of therapies. Document Released: 04/14/2012 Document Revised: 05/04/2013 Document Reviewed: 04/14/2012 [facility] Patient Information 2015 [employer] [place]. This information is not intended to replace advice given to you by your health care provider. Make sure you discuss any questions you have with your health care provider.

## 2014-02-16 ENCOUNTER — Encounter: Payer: Self-pay | Admitting: Family Medicine

## 2014-02-16 DIAGNOSIS — F411 Generalized anxiety disorder: Secondary | ICD-10-CM | POA: Insufficient documentation

## 2014-03-10 ENCOUNTER — Telehealth: Payer: Self-pay | Admitting: Family Medicine

## 2014-03-10 DIAGNOSIS — R030 Elevated blood-pressure reading, without diagnosis of hypertension: Principal | ICD-10-CM

## 2014-03-10 DIAGNOSIS — IMO0001 Reserved for inherently not codable concepts without codable children: Secondary | ICD-10-CM

## 2014-03-10 MED ORDER — PROPRANOLOL HCL ER 80 MG PO CP24: 80.0000 mg | ORAL_CAPSULE | Freq: Every day | ORAL | Status: DC

## 2014-03-10 NOTE — Telephone Encounter (Signed)
Spoke with pt, advised him his Rx was sent in. Pt understood.

## 2014-03-10 NOTE — Telephone Encounter (Signed)
Refill on BP medication that patient does not know the name of.   [phone]

## 2014-03-29 ENCOUNTER — Ambulatory Visit (INDEPENDENT_AMBULATORY_CARE_PROVIDER_SITE_OTHER): Payer: BLUE CROSS/BLUE SHIELD | Admitting: Family Medicine

## 2014-03-29 ENCOUNTER — Encounter: Payer: Self-pay | Admitting: Family Medicine

## 2014-03-29 VITALS — BP 136/88 | HR 61 | Temp 99.4°F | Resp 16 | Ht 71.5 in | Wt 186.4 lb

## 2014-03-29 DIAGNOSIS — F1911 Other psychoactive substance abuse, in remission: Secondary | ICD-10-CM

## 2014-03-29 DIAGNOSIS — F411 Generalized anxiety disorder: Secondary | ICD-10-CM | POA: Diagnosis not present

## 2014-03-29 DIAGNOSIS — J301 Allergic rhinitis due to pollen: Secondary | ICD-10-CM | POA: Diagnosis not present

## 2014-03-29 DIAGNOSIS — F1910 Other psychoactive substance abuse, uncomplicated: Secondary | ICD-10-CM | POA: Diagnosis not present

## 2014-03-29 DIAGNOSIS — I10 Essential (primary) hypertension: Secondary | ICD-10-CM | POA: Diagnosis not present

## 2014-03-29 DIAGNOSIS — S86892A Other injury of other muscle(s) and tendon(s) at lower leg level, left leg, initial encounter: Secondary | ICD-10-CM | POA: Diagnosis not present

## 2014-03-29 MED ORDER — FLUTICASONE PROPIONATE 50 MCG/ACT NA SUSP: 2.0000 | Freq: Every day | NASAL | Status: DC

## 2014-03-29 MED ORDER — MELOXICAM 15 MG PO TABS: 15.0000 mg | ORAL_TABLET | Freq: Every day | ORAL | Status: DC

## 2014-03-29 MED ORDER — SERTRALINE HCL 100 MG PO TABS: 100.0000 mg | ORAL_TABLET | Freq: Every day | ORAL | Status: DC

## 2014-03-29 NOTE — Patient Instructions (Signed)
Medial Tibial Stress Syndrome (Shin Splints) with Rehab Medial tibial stress syndrome is also called shin splints. Shin splints is a term that is broadly used to describe pain in the lower leg. Shin splints most commonly involve inflammation of the bone lining (periostitis). SYMPTOMS   Pain in the front, or more commonly, the inner part of the lower half of the leg (shin), above the ankle.  Pain that first occurs after exercise, and eventually progresses to pain at the beginning of exercise, that decreases after a short warm up period.  With continued exercise and if left untreated, constant pain that eventually causes the athlete to stop playing sports. CAUSES  Shin splints are an overuse injury, in which the bone lining (periosteum) is broken down at a faster rate than it can be repaired. This leads to inflammation of the periosteum and pain.  RISK INCREASES WITH:  Weakness or imbalance of the muscles of the leg and calf.  Poor strength and flexibility. Failure to warm up properly before activity.  Sports that require repetitive loading or running (marathon running, soccer, walking, jogging), especially on uneven ground or hard surfaces (concrete).  Lack of conditioning, early in the season or practice.  Poor running technique.  Flat feet.  Sudden change in activity intensity, frequency, or duration. PREVENTION  Warm up and stretch properly before activity.  Allow for adequate recovery between workouts.  Maintain physical fitness:  Strength, flexibility, and endurance.  Cardiovascular fitness.  Ensure properly fitted and cushioned shoes.  Wear cushioned arch supports.  Learn and use proper technique and have a coach correct improper technique.  Increase activity gradually.  Run on surfaces that absorb shock, such as grass, composite track, or sand (beach). PROGNOSIS  If treated properly with a slow return to activity, shin splints usually heal within 2 to 8 weeks.    RELATED COMPLICATIONS   Recurring symptoms, that result in a chronic problem.  Longer healing time, if not properly treated or if not given enough time to heal.  Altered level of performance or need to end sports participation, due to pain if activity is continued without treatment. TREATMENT Treatment first involves the use of ice and medicine, to reduce pain and inflammation. The use of strengthening and stretching exercises may help reduce pain with activity. These exercises may be performed at home or with a therapist. For individuals with flat feet, the use of arch supports (orthotics) may be helpful. Sometimes, taping, casting, or bracing the leg may be advised. Slow return to activity is allowed after pain is gone. Rarely, surgery is attempted to remove the chronically inflamed tissue.  MEDICATION  If pain medicine is needed, nonsteroidal anti-inflammatory medicines (aspirin and ibuprofen), or other minor pain relievers (acetaminophen), are often advised.  Do not take pain medicine for 7 days before surgery.  Prescription pain relievers may be given, if your caregiver thinks they are needed. Use only as directed and only as much as you need.  Ointments applied to the skin may be helpful. HEAT AND COLD  Cold treatment (icing) should be applied for 10 to 15 minutes every 2 to 3 hours for inflammation and pain, and immediately after activity that aggravates your symptoms. Use ice packs or an ice massage.  Heat treatment may be used before performing stretching and strengthening activities prescribed by your caregiver, physical therapist, or athletic trainer. Use a heat pack or a warm water soak. SEEK MEDICAL CARE IF:   Symptoms get worse or do not improve in 4   to 6 weeks, despite treatment.  New, unexplained symptoms develop. (Drugs used in treatment may produce side effects.) EXERCISES RANGE OF MOTION (ROM) AND STRETCHING EXERCISES - Medial Tibial Stress Syndrome (Shin  Splints) These exercises may help you when beginning to rehabilitate your injury. Your symptoms may resolve with or without further involvement from your physician, physical therapist or athletic trainer. While completing these exercises, remember:   Restoring tissue flexibility helps normal motion to return to the joints. This allows healthier, less painful movement and activity.  An effective stretch should be held for at least 30 seconds.  A stretch should never be painful. You should only feel a gentle lengthening or release in the stretched tissue. STRETCH - Gastroc, Standing  Place your hands on a wall.  Extend your right / left leg behind you, keeping the front knee somewhat bent.  Slightly point your toes inward on your back foot.  Keeping your right / left heel on the floor and your knee straight, shift your weight toward the wall, not allowing your back to arch.  You should feel a gentle stretch in the right / left calf. Hold this position for __________ seconds. Repeat __________ times. Complete this stretch __________ times per day. STRETCH - Soleus, Standing   Place your hands on a wall.  Extend your right / left leg behind you, keeping the other knee somewhat bent.  Slightly point your toes inward on your back foot.  Keep your right / left heel on the floor, bend your back knee, and slightly shift your weight over the back leg so that you feel a gentle stretch deep in your back calf.  Hold this position for __________ seconds. Repeat __________ times. Complete this stretch __________ times per day. STRETCH - Gastrocsoleus, Standing  Note: This exercise can place a lot of stress on your foot and ankle. Please complete this exercise only if specifically instructed by your caregiver.   Place the ball of your right / left foot on a step, keeping your other foot firmly on the same step.  Hold on to the wall or a rail for balance.  Slowly lift your other foot, allowing  your body weight to press your heel down over the edge of the step.  You should feel a stretch in your right / left calf.  Hold this position for __________ seconds.  Repeat this exercise with a slight bend in your right / left knee. Repeat __________ times. Complete this stretch __________ times per day.  RANGE OF MOTION - Ankle Eversion   Sit with your right / left ankle crossed over your opposite knee.  Grip your foot with your opposite hand, placing your thumb on the top of your foot and your fingers across the bottom of your foot.  Gently push your foot downward with a slight rotation so your littlest toes rise slightly toward the ceiling.  You should feel a gentle stretch on the inside of your ankle. Hold the stretch for __________ seconds. Repeat __________ times. Complete this exercise __________ times per day.  RANGE OF MOTION - Ankle Inversion  Sit with your right / left ankle crossed over your opposite knee.  Grip your foot with your opposite hand, placing your thumb on the bottom of your foot and your fingers across the top of your foot.  Gently pull your foot so the smallest toe comes toward you and your thumb pushes the inside of the ball of your foot away from you.  You should   feel a gentle stretch on the outside of your ankle. Hold the stretch for __________ seconds. Repeat __________ times. Complete this exercise __________ times per day.  RANGE OF MOTION- Ankle Plantar Flexion   Sit with your right / left leg crossed over your opposite knee.  Use your opposite hand to pull the top of your foot and toes toward you.  You should feel a gentle stretch on the top of your foot and ankle. Hold this position for __________ seconds. Repeat __________ times. Complete __________ times per day.  STRENGTHENING EXERCISES - Medial Tibial Stress Syndrome (Shin Splints) These exercises may help you when beginning to rehabilitate your injury. They may resolve your symptoms with  or without further involvement from your physician, physical therapist or athletic trainer. While completing these exercises, remember:   Muscles can gain both the endurance and the strength needed for everyday activities through controlled exercises.  Complete these exercises as instructed by your physician, physical therapist or athletic trainer. Increase the resistance and repetitions only as guided by your caregiver. STRENGTH - Dorsiflexors  Secure a rubber exercise band or tubing to a fixed object (table, pole) and loop the other end around your right / left foot.  Sit on the floor facing the fixed object. The band should be slightly tense when your foot is relaxed.  Slowly draw your foot back toward you, using your ankle and toes.  Hold this position for __________ seconds. Slowly release the tension in the band, return your foot to the starting position. Repeat __________ times. Complete this exercise __________ times per day.  STRENGTH - Towel Curls  Sit in a chair, on a non-carpeted surface.  Place your foot on a towel, keeping your heel on the floor.  Pull the towel toward your heel only by curling your toes. Keep your heel on the floor.  If instructed by your physician, physical therapist or athletic trainer, you may add weight at the end of the towel. Repeat __________ times. Complete this exercise __________ times per day. STRENGTH - Ankle Inversion  Secure one end of a rubber exercise band or tubing to a fixed object (table, pole). Loop the other end around your foot, just before your toes.  Place your fists between your knees. This will focus your strengthening at your ankle.  Slowly, pull your big toe up and in, making sure the band is positioned to resist the entire motion.  Hold this position for __________ seconds.  Have your muscles resist the band, as it slowly pulls your foot back to the starting position. Repeat __________ times. Complete this exercises  __________ times per day.  Document Released: 12/18/2004 Document Revised: 03/12/2011 Document Reviewed: 04/01/2008 [employer] Patient Information 2015 [employer]. This information is not intended to replace advice given to you by your health care provider. Make sure you discuss any questions you have with your health care provider.  

## 2014-03-29 NOTE — Progress Notes (Signed)
Subjective:    Patient ID: Kyle Allen, male    DOB: October 31, 1970, 44 y.o.   MRN: [id number]  03/29/2014  Follow-up; Hypertension; and Anxiety   HPI This 44 y.o. male presents for six week follow-up of the following:  1. HTN: started Propanolol ER 17m daily at last visit.  Doing well on medication.  Having one cup of coffee every morning; on [place] lunch.  Not checked BP at work.  Palpitations during a couple of stressful situations since last visit.  2. Anxiety:  Management changes made at last visit included weaning Prozac and starting Zoloft 562mdialy.  No worsening anxiety since last visit; taking Sertraline 5011mne tablet daily in morning.  No side effects.  Fidgety throughout the day.  Still has shakiness during the day.  Sleeping really well.  No excessive worry.  Counseling support by sponsor.  No SI/HI.    3.  Allergies:  Worsening since last visit six weeks ago.  Started [employer] account plannerDoing much better; rhinorrhea improved.  4. Insomnia: using Trazodone twice per week; otherwise sleeping well.  Sleeping five hours hard.    5.  Shin splint L: onset two weeks ago.  L lateral lower shin region.  No injury. Walks on concrete during work; excessive walking at work. No related n/t/burning; no lower back pain or knee pain. No calf pain. Worse with walking at work. Wearing good supportive boot at work.  6.  Substance abuse: has almost reached one year of sobriety; talking with sponsor daily; attending meetings five days per week.  Living in recovery house.    Review of Systems  Constitutional: Negative for fever, chills, diaphoresis, activity change, appetite change and fatigue.  Eyes: Negative for visual disturbance.  Respiratory: Negative for cough and shortness of breath.   Cardiovascular: Positive for palpitations. Negative for chest pain and leg swelling.  Endocrine: Negative for cold intolerance, heat intolerance, polydipsia, polyphagia and polyuria.    Musculoskeletal: Positive for myalgias, arthralgias and gait problem. Negative for joint swelling.  Neurological: Negative for dizziness, tremors, seizures, syncope, facial asymmetry, speech difficulty, weakness, light-headedness, numbness and headaches.  Psychiatric/Behavioral: Negative for suicidal ideas, sleep disturbance, self-injury and dysphoric mood. The patient is nervous/anxious.     Past Medical History  Diagnosis Date  . Allergy   . Anxiety   . Depression   . Substance abuse    History reviewed. No pertinent past surgical history. No Known Allergies Current Outpatient Prescriptions  Medication Sig Dispense Refill  . propranolol ER (INDERAL LA) 80 MG 24 hr capsule Take 1 capsule (80 mg total) by mouth daily. 30 capsule 1  . sertraline (ZOLOFT) 100 MG tablet Take 1 tablet (100 mg total) by mouth daily. 30 tablet 5  . FLUoxetine (PROZAC) 20 MG tablet Take 2 tablets (40 mg total) by mouth daily. (Patient not taking: Reported on 03/29/2014) 60 tablet 2  . fluticasone (FLONASE) 50 MCG/ACT nasal spray Place 2 sprays into both nostrils daily. 16 g 11  . meloxicam (MOBIC) 15 MG tablet Take 1 tablet (15 mg total) by mouth daily. 30 tablet 0  . traZODone (DESYREL) 50 MG tablet Take 1 tablet (50 mg total) by mouth at bedtime as needed for sleep. (Patient not taking: Reported on 03/29/2014) 30 tablet 3  . valACYclovir (VALTREX) 1000 MG tablet Take 1 tablet (1,000 mg total) by mouth 2 (two) times daily. (Patient not taking: Reported on 01/04/2014) 14 tablet 1   No current facility-administered medications for this  visit.       Objective:    BP 136/88 mmHg  Pulse 61  Temp(Src) 99.4 F (37.4 C) (Oral)  Resp 16  Ht 5' 11.5" (1.816 m)  Wt 186 lb 6.4 oz (84.55 kg)  BMI 25.64 kg/m2  SpO2 97% Physical Exam  Constitutional: He is oriented to person, place, and time. He appears well-developed and well-nourished. No distress.  HENT:  Head: Normocephalic and atraumatic.  Right Ear:  External ear normal.  Left Ear: External ear normal.  Nose: Nose normal.  Mouth/Throat: Oropharynx is clear and moist.  Eyes: Conjunctivae and EOM are normal. Pupils are equal, round, and reactive to light.  Neck: Normal range of motion. Neck supple. Carotid bruit is not present. No thyromegaly present.  Cardiovascular: Normal rate, regular rhythm, normal heart sounds and intact distal pulses.  Exam reveals no gallop and no friction rub.   No murmur heard. Pulmonary/Chest: Effort normal and breath sounds normal. He has no wheezes. He has no rales.  Abdominal: Soft. Bowel sounds are normal. He exhibits no distension and no mass. There is no tenderness. There is no rebound and no guarding.  Musculoskeletal:       Left knee: Normal. He exhibits normal range of motion and no swelling. No tenderness found.       Left ankle: Normal. He exhibits normal range of motion. No tenderness.       Left lower leg: He exhibits no tenderness.       Legs: +TTP distal lateral L shin.  No swelling.    Lymphadenopathy:    He has no cervical adenopathy.  Neurological: He is alert and oriented to person, place, and time. No cranial nerve deficit.  Skin: Skin is warm and dry. No rash noted. He is not diaphoretic.  Psychiatric: He has a normal mood and affect. His behavior is normal.  Nursing note and vitals reviewed.  Results for orders placed or performed in visit on 02/15/14  CBC with Differential/Platelet  Result Value Ref Range   WBC 10.5 4.0 - 10.5 K/uL   RBC 4.48 4.22 - 5.81 MIL/uL   Hemoglobin 15.0 13.0 - 17.0 g/dL   HCT 42.8 39.0 - 52.0 %   MCV 95.5 78.0 - 100.0 fL   MCH 33.5 26.0 - 34.0 pg   MCHC 35.0 30.0 - 36.0 g/dL   RDW 12.8 11.5 - 15.5 %   Platelets 287 150 - 400 K/uL   MPV 9.0 8.6 - 12.4 fL   Neutrophils Relative % 62 43 - 77 %   Neutro Abs 6.5 1.7 - 7.7 K/uL   Lymphocytes Relative 28 12 - 46 %   Lymphs Abs 2.9 0.7 - 4.0 K/uL   Monocytes Relative 6 3 - 12 %   Monocytes Absolute 0.6 0.1  - 1.0 K/uL   Eosinophils Relative 4 0 - 5 %   Eosinophils Absolute 0.4 0.0 - 0.7 K/uL   Basophils Relative 0 0 - 1 %   Basophils Absolute 0.0 0.0 - 0.1 K/uL   Smear Review Criteria for review not met   Comprehensive metabolic panel  Result Value Ref Range   Sodium 136 135 - 145 mEq/L   Potassium 4.5 3.5 - 5.3 mEq/L   Chloride 102 96 - 112 mEq/L   CO2 26 19 - 32 mEq/L   Glucose, Bld 90 70 - 99 mg/dL   BUN 14 6 - 23 mg/dL   Creat 0.97 0.50 - 1.35 mg/dL   Total Bilirubin 0.4 0.2 -  1.2 mg/dL   Alkaline Phosphatase 36 (L) 39 - 117 U/L   AST 16 0 - 37 U/L   ALT 16 0 - 53 U/L   Total Protein 7.2 6.0 - 8.3 g/dL   Albumin 4.9 3.5 - 5.2 g/dL   Calcium 9.9 8.4 - 10.5 mg/dL  TSH  Result Value Ref Range   TSH 1.460 0.350 - 4.500 uIU/mL  POCT urinalysis dipstick  Result Value Ref Range   Color, UA yellow    Clarity, UA clear    Glucose, UA neg    Bilirubin, UA neg    Ketones, UA neg    Spec Grav, UA 1.010    Blood, UA neg    pH, UA 5.5    Protein, UA neg    Urobilinogen, UA 0.2    Nitrite, UA neg    Leukocytes, UA Negative        Assessment & Plan:   1. Generalized anxiety disorder   2. Shin splints, left, initial encounter   3. Essential hypertension, benign   4. Allergic rhinitis due to pollen   5. Substance abuse in remission     1. Generalized anxiety disorder: stable; increase Zoloft to 15m daily.  RTC two months.  2.  L shin splint: New. Recommend rest, ice, Meloxicam.  Home exercise program provided.  Good supportive shoe. 3.  HTN: improved; no changes to management. 4.  Allergic Rhinitis: uncontrolled; doing well now on Allegra and Flonase; rx for Flonase sent to pharmacy. 5.  Substance abuse: stable; maintaining sobriety; about to approach one year of recovery.   Meds ordered this encounter  Medications  . sertraline (ZOLOFT) 100 MG tablet    Sig: Take 1 tablet (100 mg total) by mouth daily.    Dispense:  30 tablet    Refill:  5  . meloxicam (MOBIC) 15 MG  tablet    Sig: Take 1 tablet (15 mg total) by mouth daily.    Dispense:  30 tablet    Refill:  0  . fluticasone (FLONASE) 50 MCG/ACT nasal spray    Sig: Place 2 sprays into both nostrils daily.    Dispense:  16 g    Refill:  11    Return in about 2 months (around 05/29/2014) for recheck.     Shereese Bonnie MElayne Guerin M.D. [id number]  [phone]

## 2014-04-05 ENCOUNTER — Ambulatory Visit (INDEPENDENT_AMBULATORY_CARE_PROVIDER_SITE_OTHER): Payer: BLUE CROSS/BLUE SHIELD | Admitting: Family Medicine

## 2014-04-05 VITALS — BP 150/90 | HR 65 | Temp 98.5°F | Resp 18 | Ht 72.0 in | Wt 187.0 lb

## 2014-04-05 DIAGNOSIS — H6122 Impacted cerumen, left ear: Secondary | ICD-10-CM | POA: Diagnosis not present

## 2014-04-05 NOTE — Patient Instructions (Signed)
We irrigated your ear and removed the wax  Let us know if you have any trouble with your ear in the next few days Ask the nurses at work to check your BP

## 2014-04-05 NOTE — Progress Notes (Signed)
[facility] [address], [place] [address] [phone]  Date:  04/05/2014   Name:  Kyle Allen   DOB:  11/06/1970   MRN:  [id number]  PCP:  Nilda SimmerSMITH,KRISTI, MD    Chief Complaint: Otalgia   History of Present Illness:  Kyle Allen is a 44 y.o. very pleasant male patient who presents with the following:  Here today with a concern with his left ear.  It has felt clogged- he has tried peroxide, olive oil, ear candle, some OTC wax remover.  It seems to be ringing, and will occasinally seem to pop.   He cannot hear that well.  The right ear is pretty good.  He has never needed irrigation of wax in the past  Patient Active Problem List   Diagnosis Date Noted  . Anxiety state 02/16/2014  . Anxiety and depression 09/18/2013  . Insomnia 09/18/2013  . Tobacco abuse 09/18/2013  . Substance abuse in remission 09/18/2013  . Herpes labialis 09/18/2013    Past Medical History  Diagnosis Date  . Allergy   . Anxiety   . Depression   . Substance abuse     History reviewed. No pertinent past surgical history.  History  Substance Use Topics  . Smoking status: Current Every Day Smoker -- 1.00 packs/day for 20 years    Types: Cigarettes  . Smokeless tobacco: Not on file  . Alcohol Use: No    Family History  Problem Relation Age of Onset  . Hyperlipidemia Mother   . Hypertension Mother   . Hyperlipidemia Father   . Hypertension Father   . Hyperlipidemia Brother   . Hypertension Brother   . Cancer Maternal Grandmother   . Hyperlipidemia Maternal Grandmother   . Hypertension Maternal Grandmother   . Hyperlipidemia Maternal Grandfather   . Hypertension Maternal Grandfather   . Diabetes Maternal Grandfather   . Hyperlipidemia Paternal Grandmother   . Hypertension Paternal Grandmother   . Hyperlipidemia Paternal Grandfather   . Hypertension Paternal Grandfather   . Heart disease Paternal Grandfather     No Known Allergies  Medication list has been  reviewed and updated.  Current Outpatient Prescriptions on File Prior to Visit  Medication Sig Dispense Refill  . FLUoxetine (PROZAC) 20 MG tablet Take 2 tablets (40 mg total) by mouth daily. 60 tablet 2  . fluticasone (FLONASE) 50 MCG/ACT nasal spray Place 2 sprays into both nostrils daily. 16 g 11  . meloxicam (MOBIC) 15 MG tablet Take 1 tablet (15 mg total) by mouth daily. 30 tablet 0  . propranolol ER (INDERAL LA) 80 MG 24 hr capsule Take 1 capsule (80 mg total) by mouth daily. 30 capsule 1  . sertraline (ZOLOFT) 100 MG tablet Take 1 tablet (100 mg total) by mouth daily. 30 tablet 5  . traZODone (DESYREL) 50 MG tablet Take 1 tablet (50 mg total) by mouth at bedtime as needed for sleep. 30 tablet 3  . valACYclovir (VALTREX) 1000 MG tablet Take 1 tablet (1,000 mg total) by mouth 2 (two) times daily. 14 tablet 1   No current facility-administered medications on file prior to visit.    Review of Systems:  As per HPI- otherwise negative.   Physical Examination: Filed Vitals:   04/05/14 1359  BP: 152/100  Pulse: 65  Temp: 98.5 F (36.9 C)  Resp: 18   Filed Vitals:   04/05/14 1359  Height: 6' (1.829 m)  Weight: 187 lb (84.823 kg)   Body mass index is  25.36 kg/(m^2). Ideal Body Weight: Weight in (lb) to have BMI = 25: 183.9  GEN: WDWN, NAD, Non-toxic, A & O x 3 HEENT: Atraumatic, Normocephalic. Neck supple. No masses, No LAD. Ears and Nose: No external deformity. CV: RRR, No M/G/R. No JVD. No thrill. No extra heart sounds. PULM: CTA B, no wheezes, crackles, rhonchi. No retractions. No resp. distress. No accessory muscle use. EXTR: No c/c/e NEURO Normal gait.  PSYCH: Normally interactive. Conversant. Not depressed or anxious appearing.  Calm demeanor.  Left TM is occluded with wax, right is WNL This was irrigated and removed- normal TM after irrigation   Assessment and Plan: Cerumen impaction, left  Removed earwax with irrigation Follow-up as needed He will see Dr.  Katrinka Blazing next month to follow-up his HTN   Signed Abbe Amsterdam, MD

## 2014-04-07 ENCOUNTER — Other Ambulatory Visit: Payer: Self-pay | Admitting: Family Medicine

## 2014-05-24 ENCOUNTER — Ambulatory Visit: Payer: BLUE CROSS/BLUE SHIELD | Admitting: Family Medicine

## 2014-06-02 ENCOUNTER — Ambulatory Visit (INDEPENDENT_AMBULATORY_CARE_PROVIDER_SITE_OTHER): Payer: BLUE CROSS/BLUE SHIELD | Admitting: Family Medicine

## 2014-06-02 ENCOUNTER — Encounter: Payer: Self-pay | Admitting: Family Medicine

## 2014-06-02 VITALS — BP 120/72 | HR 61 | Temp 98.9°F | Resp 16 | Ht 71.75 in | Wt 187.6 lb

## 2014-06-02 DIAGNOSIS — K130 Diseases of lips: Secondary | ICD-10-CM

## 2014-06-02 DIAGNOSIS — F17200 Nicotine dependence, unspecified, uncomplicated: Secondary | ICD-10-CM

## 2014-06-02 DIAGNOSIS — Z720 Tobacco use: Secondary | ICD-10-CM

## 2014-06-02 DIAGNOSIS — I10 Essential (primary) hypertension: Secondary | ICD-10-CM

## 2014-06-02 DIAGNOSIS — F329 Major depressive disorder, single episode, unspecified: Secondary | ICD-10-CM | POA: Diagnosis not present

## 2014-06-02 DIAGNOSIS — F1910 Other psychoactive substance abuse, uncomplicated: Secondary | ICD-10-CM | POA: Diagnosis not present

## 2014-06-02 DIAGNOSIS — F411 Generalized anxiety disorder: Secondary | ICD-10-CM

## 2014-06-02 DIAGNOSIS — G4700 Insomnia, unspecified: Secondary | ICD-10-CM | POA: Diagnosis not present

## 2014-06-02 DIAGNOSIS — F419 Anxiety disorder, unspecified: Secondary | ICD-10-CM | POA: Diagnosis not present

## 2014-06-02 DIAGNOSIS — S00521A Blister (nonthermal) of lip, initial encounter: Secondary | ICD-10-CM

## 2014-06-02 DIAGNOSIS — F32A Depression, unspecified: Secondary | ICD-10-CM

## 2014-06-02 DIAGNOSIS — F1911 Other psychoactive substance abuse, in remission: Secondary | ICD-10-CM

## 2014-06-02 MED ORDER — SERTRALINE HCL 100 MG PO TABS: 100.0000 mg | ORAL_TABLET | Freq: Every day | ORAL | Status: DC

## 2014-06-02 MED ORDER — VALACYCLOVIR HCL 1 G PO TABS: 2000.0000 mg | ORAL_TABLET | Freq: Two times a day (BID) | ORAL | Status: DC

## 2014-06-02 NOTE — Progress Notes (Signed)
Subjective:    Patient ID: Kyle Allen, male    DOB: 1970/09/11, 44 y.o.   MRN: [id number]  06/02/2014  Follow-up; Hypertension; Anxiety; shins; and bump on bottom lip x 1-2 days   HPI This 43 y.o. male presents for two month follow-up:  1.  Anxiety disorder: increased Zoloft to $RemoveB'100mg'CWdqFapD$  daily at last visit.  Thinking all night long; excessive worry.  Out of control.  Worrying about getting out of [facility]; will be one year in July.  Excessive worry about work the next day.  No major demands at work.  Chronic worrier at baseline.  Mom is like this.  Mother is not being treated for anxiety.  No change.  Still sleeping well at night; was on call last week; slept through phone calls.  Not taking Trazodone.  Will take Melatonin some.  Very hard sleeper.  Wakes up at 5:00am every morning.  Goes to bed at 9:30; wakes up at 5:00am.  Morning person.  One cup of coffee every morning; rare Mt. Dew now; drinking water all day.    2.  L shin splint: rx for Meloxicam provided.  Recommended icing and home exercise program.  L shin has improved.  Doing well.  No longer having daily pain.   3. HTN: no changes to management made at last visit.  Patient reports good compliance with medication, good tolerance to medication, and good symptom control.  Propranolol ER $RemoveBefore'80mg'cVYbDibTIutiE$  daily.  Last week, blood pressure was 146/92.  Repeat was elevated.    4.  Allergic Rhinitis: no changes to management; stable on Allegra and Flonase.  5. Substance abuse: sober at last visit; to approach one year of sobriety. Going to meetings [employer] twice per week on days off; hitting 4 meetings per week.  Still talking to sponsor every other day.  Sees sponsor at meeting.  Cravings all the time.  Still exercising.  Working 40 hours now.  No benefits yet; must do a full 90 days.  April 03, 2014.    6.  HSV labialis: recurrent this summer with sun exposure; always recurs every summer.  Requesting refill of Valtrex.  Review of Systems    Constitutional: Negative for fever, chills, diaphoresis, activity change, appetite change and fatigue.  Eyes: Negative for visual disturbance.  Respiratory: Negative for cough and shortness of breath.   Cardiovascular: Negative for chest pain, palpitations and leg swelling.  Endocrine: Negative for cold intolerance, heat intolerance, polydipsia, polyphagia and polyuria.  Musculoskeletal: Positive for arthralgias. Negative for gait problem.  Skin: Positive for rash and wound.  Neurological: Negative for dizziness, tremors, seizures, syncope, facial asymmetry, speech difficulty, weakness, light-headedness, numbness and headaches.  Psychiatric/Behavioral: Negative for suicidal ideas, sleep disturbance, self-injury and dysphoric mood. The patient is nervous/anxious.     Past Medical History  Diagnosis Date  . Allergy   . Anxiety   . Depression   . Substance abuse   . Hypertension    No past surgical history on file. No Known Allergies  History   Social History  . Marital Status: Single    Spouse Name: N/A  . Number of Children: N/A  . Years of Education: N/A   Occupational History  . Not on file.   Social History Main Topics  . Smoking status: Current Every Day Smoker -- 1.00 packs/day for 20 years    Types: Cigarettes  . Smokeless tobacco: Not on file  . Alcohol Use: No  . Drug Use: No  . Sexual Activity:  Not Currently   Other Topics Concern  . Not on file   Social History Narrative   Marital status: single; not dating.  From [place], [place].       Children:  None       Lives: at [facility] since 07/2013.  Group home of 8 men in recovery.      Employment:  Unemployed; applying for job maintenance at [employer].      Tobacco:  1 ppd x 20 years.  Never quit.      Alcohol: in recovery; drinking 1/5 per day; duration 20 years; DWIs x 5; last DWI 09/2009.  Gets license back this month; four years without license.      Drugs:  Marijuana,  prescription pills (pain medications Oxycontin, Vicodin, some benzos).  No heroine or cocaine.      Exercise:  Six months; joined [employer]; cardio to start; free weights   Family History  Problem Relation Age of Onset  . Hyperlipidemia Mother   . Hypertension Mother   . Hyperlipidemia Father   . Hypertension Father   . Hyperlipidemia Brother   . Hypertension Brother   . Cancer Maternal Grandmother   . Hyperlipidemia Maternal Grandmother   . Hypertension Maternal Grandmother   . Hyperlipidemia Maternal Grandfather   . Hypertension Maternal Grandfather   . Diabetes Maternal Grandfather   . Hyperlipidemia Paternal Grandmother   . Hypertension Paternal Grandmother   . Hyperlipidemia Paternal Grandfather   . Hypertension Paternal Grandfather   . Heart disease Paternal Grandfather        Objective:    BP 120/72 mmHg  Pulse 61  Temp(Src) 98.9 F (37.2 C) (Oral)  Resp 16  Ht 5' 11.75" (1.822 m)  Wt 187 lb 9.6 oz (85.095 kg)  BMI 25.63 kg/m2  SpO2 97% Physical Exam  Constitutional: He is oriented to person, place, and time. He appears well-developed and well-nourished. No distress.  HENT:  Head: Normocephalic and atraumatic.  Right Ear: External ear normal.  Left Ear: External ear normal.  Nose: Nose normal.  Mouth/Throat: Oropharynx is clear and moist.  +healing vesicle lower vermillion border.  Eyes: Conjunctivae and EOM are normal. Pupils are equal, round, and reactive to light.  Neck: Normal range of motion. Neck supple. Carotid bruit is not present. No thyromegaly present.  Cardiovascular: Normal rate, regular rhythm, normal heart sounds and intact distal pulses.  Exam reveals no gallop and no friction rub.   No murmur heard. Pulmonary/Chest: Effort normal and breath sounds normal. He has no wheezes. He has no rales.  Abdominal: Soft. Bowel sounds are normal. He exhibits no distension and no mass. There is no tenderness. There is no rebound and no guarding.    Lymphadenopathy:    He has no cervical adenopathy.  Neurological: He is alert and oriented to person, place, and time. No cranial nerve deficit.  Skin: Skin is warm and dry. No rash noted. He is not diaphoretic.  Psychiatric: He has a normal mood and affect. His behavior is normal.  Nursing note and vitals reviewed.  Results for orders placed or performed in visit on 02/15/14  CBC with Differential/Platelet  Result Value Ref Range   WBC 10.5 4.0 - 10.5 K/uL   RBC 4.48 4.22 - 5.81 MIL/uL   Hemoglobin 15.0 13.0 - 17.0 g/dL   HCT 42.8 39.0 - 52.0 %   MCV 95.5 78.0 - 100.0 fL   MCH 33.5 26.0 - 34.0 pg   MCHC 35.0 30.0 -  36.0 g/dL   RDW 12.8 11.5 - 15.5 %   Platelets 287 150 - 400 K/uL   MPV 9.0 8.6 - 12.4 fL   Neutrophils Relative % 62 43 - 77 %   Neutro Abs 6.5 1.7 - 7.7 K/uL   Lymphocytes Relative 28 12 - 46 %   Lymphs Abs 2.9 0.7 - 4.0 K/uL   Monocytes Relative 6 3 - 12 %   Monocytes Absolute 0.6 0.1 - 1.0 K/uL   Eosinophils Relative 4 0 - 5 %   Eosinophils Absolute 0.4 0.0 - 0.7 K/uL   Basophils Relative 0 0 - 1 %   Basophils Absolute 0.0 0.0 - 0.1 K/uL   Smear Review Criteria for review not met   Comprehensive metabolic panel  Result Value Ref Range   Sodium 136 135 - 145 mEq/L   Potassium 4.5 3.5 - 5.3 mEq/L   Chloride 102 96 - 112 mEq/L   CO2 26 19 - 32 mEq/L   Glucose, Bld 90 70 - 99 mg/dL   BUN 14 6 - 23 mg/dL   Creat 0.97 0.50 - 1.35 mg/dL   Total Bilirubin 0.4 0.2 - 1.2 mg/dL   Alkaline Phosphatase 36 (L) 39 - 117 U/L   AST 16 0 - 37 U/L   ALT 16 0 - 53 U/L   Total Protein 7.2 6.0 - 8.3 g/dL   Albumin 4.9 3.5 - 5.2 g/dL   Calcium 9.9 8.4 - 10.5 mg/dL  TSH  Result Value Ref Range   TSH 1.460 0.350 - 4.500 uIU/mL  POCT urinalysis dipstick  Result Value Ref Range   Color, UA yellow    Clarity, UA clear    Glucose, UA neg    Bilirubin, UA neg    Ketones, UA neg    Spec Grav, UA 1.010    Blood, UA neg    pH, UA 5.5    Protein, UA neg    Urobilinogen,  UA 0.2    Nitrite, UA neg    Leukocytes, UA Negative        Assessment & Plan:   1. Generalized anxiety disorder   2. Essential hypertension   3. Depression   4. Insomnia   5. Smoker   6. Anxiety   7. Blister of lip without infection, initial encounter   8. Lesion of lip   9. Substance abuse in remission     1. Anxiety disorder: uncontrolled; increase Zoloft to $RemoveB'150mg'LNLKHRIE$  daily for one month; can increase to $RemoveBef'200mg'IAtinmoisf$  if needed.   RTC two months. 2.  HTN: controlled; recent elevation out of office; continue to monitor; no changes to management at this time. 4. Insomnia: controlled; continue Trazodone PRN. 5.  Tobacco abuse: recommend cessation in upcoming year. 6.  Herpes labialis: recurrent; rx for Valtrex provided. 7. Substance abuse in remission: stable; good support from sponsor and community.   Meds ordered this encounter  Medications  . sertraline (ZOLOFT) 100 MG tablet    Sig: Take 1-2 tablets (100-200 mg total) by mouth daily.    Dispense:  60 tablet    Refill:  5  . valACYclovir (VALTREX) 1000 MG tablet    Sig: Take 2 tablets (2,000 mg total) by mouth 2 (two) times daily.    Dispense:  4 tablet    Refill:  3    Return in about 2 months (around 08/02/2014) for recheck.   Samanta Gal Elayne Guerin, M.D. [facility] [place], [place]  [address] [phone] phone [phone] fax

## 2014-06-04 ENCOUNTER — Other Ambulatory Visit: Payer: Self-pay | Admitting: Internal Medicine

## 2014-06-05 ENCOUNTER — Other Ambulatory Visit: Payer: Self-pay | Admitting: Family Medicine

## 2014-06-22 ENCOUNTER — Telehealth: Payer: Self-pay

## 2014-06-22 ENCOUNTER — Encounter: Payer: Self-pay | Admitting: Family Medicine

## 2014-06-22 NOTE — Telephone Encounter (Signed)
Pt states that he is on his way to pick up his last rx of valACYclovir (VALTREX) 1000 MG tablet [[id number]], a one day supple. He states that the blister is better, but still a decent size, and he is worried this last rx will not be enough. Please advise patient.

## 2014-06-23 NOTE — Telephone Encounter (Signed)
Call -- this medication should work well. Is this a new blister or has he had this blister or several weeks?

## 2014-06-24 MED ORDER — VALACYCLOVIR HCL 1 G PO TABS: 1000.0000 mg | ORAL_TABLET | Freq: Three times a day (TID) | ORAL | Status: DC

## 2014-06-24 NOTE — Telephone Encounter (Signed)
Left message for pt to call back  °

## 2014-06-24 NOTE — Telephone Encounter (Signed)
Call -- I sent in a ten day course of Valtrex for patient to take 1 three times daily.

## 2014-06-24 NOTE — Telephone Encounter (Signed)
Patient returned call. I let him know the message. Patient understood

## 2014-06-24 NOTE — Telephone Encounter (Signed)
Spoke with Kyle Allen, he states it is the same one and it would never go away. He refilled the medication and tried to wait before he called. He states it is still red and bumpy. Please advise.

## 2014-06-29 ENCOUNTER — Ambulatory Visit (HOSPITAL_COMMUNITY)
Admission: RE | Admit: 2014-06-29 | Discharge: 2014-06-29 | Disposition: A | Discharge status: Home / Self Care | Payer: BLUE CROSS/BLUE SHIELD | Source: Ambulatory Visit | Attending: Family Medicine | Admitting: Family Medicine

## 2014-06-29 ENCOUNTER — Ambulatory Visit (INDEPENDENT_AMBULATORY_CARE_PROVIDER_SITE_OTHER): Payer: BLUE CROSS/BLUE SHIELD | Admitting: Family Medicine

## 2014-06-29 VITALS — BP 132/54 | HR 81 | Temp 98.7°F | Resp 16 | Ht 72.25 in | Wt 179.0 lb

## 2014-06-29 DIAGNOSIS — I10 Essential (primary) hypertension: Secondary | ICD-10-CM | POA: Diagnosis not present

## 2014-06-29 DIAGNOSIS — F1910 Other psychoactive substance abuse, uncomplicated: Secondary | ICD-10-CM

## 2014-06-29 DIAGNOSIS — F411 Generalized anxiety disorder: Secondary | ICD-10-CM | POA: Diagnosis not present

## 2014-06-29 DIAGNOSIS — F1911 Other psychoactive substance abuse, in remission: Secondary | ICD-10-CM

## 2014-06-29 DIAGNOSIS — R440 Auditory hallucinations: Secondary | ICD-10-CM

## 2014-06-29 DIAGNOSIS — F32A Depression, unspecified: Secondary | ICD-10-CM

## 2014-06-29 DIAGNOSIS — R42 Dizziness and giddiness: Secondary | ICD-10-CM | POA: Diagnosis not present

## 2014-06-29 DIAGNOSIS — R51 Headache: Secondary | ICD-10-CM | POA: Diagnosis present

## 2014-06-29 DIAGNOSIS — R413 Other amnesia: Secondary | ICD-10-CM | POA: Insufficient documentation

## 2014-06-29 DIAGNOSIS — Z9119 Patient's noncompliance with other medical treatment and regimen: Secondary | ICD-10-CM | POA: Diagnosis not present

## 2014-06-29 DIAGNOSIS — Z91199 Patient's noncompliance with other medical treatment and regimen due to unspecified reason: Secondary | ICD-10-CM

## 2014-06-29 DIAGNOSIS — W57XXXA Bitten or stung by nonvenomous insect and other nonvenomous arthropods, initial encounter: Secondary | ICD-10-CM | POA: Diagnosis not present

## 2014-06-29 DIAGNOSIS — Z720 Tobacco use: Secondary | ICD-10-CM | POA: Diagnosis not present

## 2014-06-29 DIAGNOSIS — F418 Other specified anxiety disorders: Secondary | ICD-10-CM | POA: Diagnosis not present

## 2014-06-29 DIAGNOSIS — F419 Anxiety disorder, unspecified: Secondary | ICD-10-CM

## 2014-06-29 DIAGNOSIS — F329 Major depressive disorder, single episode, unspecified: Secondary | ICD-10-CM

## 2014-06-29 LAB — POCT URINALYSIS DIPSTICK
Bilirubin, UA: NEGATIVE
Blood, UA: NEGATIVE
Glucose, UA: NEGATIVE
Ketones, UA: 15
Leukocytes, UA: NEGATIVE
Nitrite, UA: NEGATIVE
Protein, UA: 100
Spec Grav, UA: 1.025
Urobilinogen, UA: 0.2
pH, UA: 5.5

## 2014-06-29 LAB — POCT CBC
Granulocyte percent: 80.9 %G — AB (ref 37–80)
HCT, POC: 40.1 % — AB (ref 43.5–53.7)
Hemoglobin: 13.1 g/dL — AB (ref 14.1–18.1)
Lymph, poc: 1.5 (ref 0.6–3.4)
MCH, POC: 32 pg — AB (ref 27–31.2)
MCHC: 32.6 g/dL (ref 31.8–35.4)
MCV: 98 fL — AB (ref 80–97)
MID (cbc): 0.5 (ref 0–0.9)
MPV: 6.1 fL (ref 0–99.8)
POC Granulocyte: 8.8 — AB (ref 2–6.9)
POC LYMPH PERCENT: 14.1 % (ref 10–50)
POC MID %: 5 %M (ref 0–12)
Platelet Count, POC: 253 10*3/uL (ref 142–424)
RBC: 4.09 M/uL — AB (ref 4.69–6.13)
RDW, POC: 15.2 %
WBC: 10.9 10*3/uL — AB (ref 4.6–10.2)

## 2014-06-29 LAB — COMPLETE METABOLIC PANEL WITHOUT GFR
AST: 30 U/L (ref 0–37)
BUN: 20 mg/dL (ref 6–23)
Calcium: 8.3 mg/dL — ABNORMAL LOW (ref 8.4–10.5)
Chloride: 111 meq/L (ref 96–112)
Creat: 1.05 mg/dL (ref 0.50–1.35)
GFR, Est African American: >89 mL/min
GFR, Est Non African American: 86 mL/min
Glucose, Bld: 86 mg/dL (ref 70–99)

## 2014-06-29 LAB — POCT UA - MICROSCOPIC ONLY
Casts, Ur, LPF, POC: NEGATIVE
Crystals, Ur, HPF, POC: NEGATIVE
Mucus, UA: POSITIVE
Yeast, UA: NEGATIVE

## 2014-06-29 LAB — COMPLETE METABOLIC PANEL WITH GFR
ALT: 31 U/L (ref 0–53)
Albumin: 4.3 g/dL (ref 3.5–5.2)
Alkaline Phosphatase: 27 U/L — ABNORMAL LOW (ref 39–117)
CO2: 17 mEq/L — ABNORMAL LOW (ref 19–32)
Potassium: 3.5 mEq/L (ref 3.5–5.3)
Sodium: 144 mEq/L (ref 135–145)
Total Bilirubin: 0.2 mg/dL (ref 0.2–1.2)
Total Protein: 6.4 g/dL (ref 6.0–8.3)

## 2014-06-29 LAB — GLUCOSE, POCT (MANUAL RESULT ENTRY): POC Glucose: 87 mg/dl (ref 70–99)

## 2014-06-29 LAB — TSH: TSH: 0.524 u[IU]/mL (ref 0.350–4.500)

## 2014-06-29 MED ORDER — SERTRALINE HCL 100 MG PO TABS: 100.0000 mg | ORAL_TABLET | Freq: Two times a day (BID) | ORAL | Status: DC

## 2014-06-29 NOTE — Progress Notes (Signed)
Chief Complaint:  Chief Complaint  Patient presents with  . Altered Mental Status  . Dizziness  . Delusional    Hearing whispering voices    HPI: Kyle Allen is a 44 y.o. male who is here for:  Feeling dizzy an doff balance on Friday  Morning did not know where he was , woke up disoriented, went to walk outside and did not recognize some things. This was Friday, went back inside and pieces started fitting back to gether, it has not recurred since.He has been ok since Friday. Still feels minimally disoriented. Stopped taking Zoloft 30 days ago and was takign 200 mg and is now on nothing. He feels he is not getting any relief from the Zoloft so he stopped. He is having jerking thing, he is disoriented and look at [employer];s phone number. His friend who is with him. He has not u. April 02 2013. [facility] and did a drug screen and [facility] and a breathylizer test and drug test was negative.   He also picked up a tick out of his leg 2 weeks ago. He has no HA , more confusion. [place]. He also hear voices.   He sees a psychiatrist but ha snot been to ne , hew as on prozac and put him on Zoloft 200 mg daily. [place]. He is hearing somebody walkign around the corner . He is not Suicidal /homicidal . He is having auditory hallucinations.   1. Anxiety disorder: increased Zoloft to 100mg  daily at last visit. Thinking all night long; excessive worry. Out of control. Worrying about getting out of [facility]; will be one year in July. Excessive worry about work the next day. No major demands at work. Chronic worrier at baseline. Mom is like this. Mother is not being treated for anxiety. No change. Still sleeping well at night; was on call last week; slept through phone calls. Not taking Trazodone. Will take Melatonin some. Very hard sleeper. Wakes up at 5:00am every morning. Goes to bed at 9:30; wakes up at 5:00am. Morning person. One cup  of coffee every morning; rare Mt. Dew now; drinking water all day.  2. Substance abuse: sober at last visit; to approach one year of sobriety. Going to meetings [employer] twice per week on days off; hitting 4 meetings per week. Still talking to sponsor every other day. Sees sponsor at meeting. Cravings all the time. Still exercising. Working 40 hours now. No benefits yet; must do a full 90 days. April 03, 2014.   Past Medical History  Diagnosis Date  . Allergy   . Anxiety   . Depression   . Substance abuse   . Hypertension    History reviewed. No pertinent past surgical history. History   Social History  . Marital Status: Single    Spouse Name: N/A  . Number of Children: N/A  . Years of Education: N/A   Social History Main Topics  . Smoking status: Current Every Day Smoker -- 1.00 packs/day for 20 years    Types: Cigarettes  . Smokeless tobacco: Not on file  . Alcohol Use: No  . Drug Use: No  . Sexual Activity: Not Currently   Other Topics Concern  . None   Social History Narrative   Marital status: single; not dating.  [place]       Children:  None       Lives: at [facility] since  07/2013.  Group home of 8 men in recovery.      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      Tobacco:  1 ppd x 20 years.  Never quit.      Alcohol: in recovery; drinking 1/5 per day; duration 20 years; DWIs x 5; last DWI 09/2009.  Gets license back this month; four years without license.      Drugs:  Marijuana, prescription pills (pain medications Oxycontin, Vicodin, some benzos).  No heroine or cocaine.      Exercise:  Six months; joined [employer]; cardio to start; free weights   Family History  Problem Relation Age of Onset  . Hyperlipidemia Mother   . Hypertension Mother   . Hyperlipidemia Father   . Hypertension Father   . Hyperlipidemia Brother   . Hypertension Brother   . Cancer Maternal Grandmother    . Hyperlipidemia Maternal Grandmother   . Hypertension Maternal Grandmother   . Hyperlipidemia Maternal Grandfather   . Hypertension Maternal Grandfather   . Diabetes Maternal Grandfather   . Hyperlipidemia Paternal Grandmother   . Hypertension Paternal Grandmother   . Hyperlipidemia Paternal Grandfather   . Hypertension Paternal Grandfather   . Heart disease Paternal Grandfather    No Known Allergies Prior to Admission medications   Medication Sig Start Date End Date Taking? Authorizing Provider  fluticasone (FLONASE) 50 MCG/ACT nasal spray Place 2 sprays into both nostrils daily. 03/29/14  Yes Ethelda Chick, MD  propranolol ER (INDERAL LA) 80 MG 24 hr capsule Take 1 capsule (80 mg total) by mouth daily. 03/10/14  Yes Ethelda Chick, MD  propranolol ER (INDERAL LA) 80 MG 24 hr capsule TAKE 1 CAPSULE BY MOUTH DAILY Patient not taking: Reported on 06/29/2014 04/07/14   Ethelda Chick, MD  sertraline (ZOLOFT) 100 MG tablet Take 1-2 tablets (100-200 mg total) by mouth daily. Patient not taking: Reported on 06/29/2014 06/02/14   Ethelda Chick, MD  traZODone (DESYREL) 50 MG tablet TAKE 1 TABLET BY MOUTH AT BEDTIME AS NEEDED FOR SLEEP Patient not taking: Reported on 06/29/2014 06/04/14   Ethelda Chick, MD  valACYclovir (VALTREX) 1000 MG tablet Take 2 tablets (2,000 mg total) by mouth 2 (two) times daily. Patient not taking: Reported on 06/29/2014 06/02/14   Ethelda Chick, MD  valACYclovir (VALTREX) 1000 MG tablet Take 1 tablet (1,000 mg total) by mouth 3 (three) times daily. Patient not taking: Reported on 06/29/2014 06/24/14   Ethelda Chick, MD     ROS: The patient denies fevers, chills, night sweats, unintentional weight loss, chest pain, palpitations, wheezing, dyspnea on exertion, nausea, vomiting, abdominal pain, dysuria, hematuria, melena, numbness, , or tingling.   All other systems have been reviewed and were otherwise negative with the exception of those mentioned in the HPI and as above.      PHYSICAL EXAM: Filed Vitals:   06/29/14 1155  BP: 132/54  Pulse: 81  Temp: 98.7 F (37.1 C)  Resp: 16   Filed Vitals:   06/29/14 1155  Height: 6' 0.25" (1.835 m)  Weight: 179 lb (81.194 kg)   Body mass index is 24.11 kg/(m^2).   General: Alert, no acute distress HEENT:  Normocephalic, atraumatic, oropharynx patent. EOMI, PERRLA Funduscopic exam normal Cardiovascular:  Regular rate and rhythm, no rubs murmurs or gallops.  No Carotid bruits, radial pulse intact. No pedal edema.  Respiratory: Clear to auscultation bilaterally.  No wheezes, rales, or rhonchi.  No  cyanosis, no use of accessory musculature GI: No organomegaly, abdomen is soft and non-tender, positive bowel sounds.  No masses. Skin: No rashes. Neurologic: Facial musculature symmetric. Cranial nerves II-12 grossly normal, no nystagmus. 3 out of 3 recall Psychiatric: Patient is appropriate throughout our interaction. Lymphatic: No cervical lymphadenopathy Musculoskeletal: Gait intact. 5 out of 5 strength, no appreciable myoclonus.    LABS: Results for orders placed or performed in visit on 06/29/14  POCT CBC  Result Value Ref Range   WBC 10.9 (A) 4.6 - 10.2 K/uL   Lymph, poc 1.5 0.6 - 3.4   POC LYMPH PERCENT 14.1 10 - 50 %L   MID (cbc) 0.5 0 - 0.9   POC MID % 5.0 0 - 12 %M   POC Granulocyte 8.8 (A) 2 - 6.9   Granulocyte percent 80.9 (A) 37 - 80 %G   RBC 4.09 (A) 4.69 - 6.13 M/uL   Hemoglobin 13.1 (A) 14.1 - 18.1 g/dL   HCT, POC 16.1 (A) 09.6 - 53.7 %   MCV 98.0 (A) 80 - 97 fL   MCH, POC 32.0 (A) 27 - 31.2 pg   MCHC 32.6 31.8 - 35.4 g/dL   RDW, POC 04.5 %   Platelet Count, POC 253 142 - 424 K/uL   MPV 6.1 0 - 99.8 fL  POCT UA - Microscopic Only  Result Value Ref Range   WBC, Ur, HPF, POC 1-3    RBC, urine, microscopic 0-2    Bacteria, U Microscopic trace    Mucus, UA positive    Epithelial cells, urine per micros 0-1    Crystals, Ur, HPF, POC neg    Casts, Ur, LPF, POC neg    Yeast, UA neg   POCT  urinalysis dipstick  Result Value Ref Range   Color, UA yellow    Clarity, UA hazy    Glucose, UA neg    Bilirubin, UA neg    Ketones, UA 15    Spec Grav, UA 1.025    Blood, UA neg    pH, UA 5.5    Protein, UA 100    Urobilinogen, UA 0.2    Nitrite, UA neg    Leukocytes, UA Negative Negative  POCT glucose (manual entry)  Result Value Ref Range   POC Glucose 87 70 - 99 mg/dl     EKG/XRAY:   Primary read interpreted by Dr. Conley Rolls at [facility].   ASSESSMENT/PLAN: Encounter Diagnoses  Name Primary?  . Acute confusional state Yes  . Tick bite   . Auditory hallucinations   . Dizziness   . Anxiety and depression   . Tobacco abuse   . Substance abuse in remission   . Noncompliance    Sxs most likely related to noncompliance with zoloft, stopped zoloft 200 mg without taper about 2-3 weeks ago. He is not suicidal or homicidal .  Advised to push fluids, water is better than [employer] for tick bite but I highly suspect this is not related to a tick bite. I think that he is just off of his Zoloft medications which he has been on before. He did not have auditory hallucinations while he was on the Zoloft. We will get a stat CT scan. He currently is in a substance abuse rehabilitation facility and has been sober since 04/02/2013 He has no homicidal, suicidal ideations. He is here with a friend. They will drive to go get the CT scan. And also pick up his medications to take. Labs pending: urine  drug screen, CMP, TSH Follow-up in one day  Gross sideeffects, risk and benefits, and alternatives of medications d/w patient. Patient is aware that all medications have potential sideeffects and we are unable to predict every sideeffect or drug-drug interaction that may occur.  Aaliah Jorgenson PHUONG, DO 06/29/2014 1:18 PM   6:17 pm -Called patient and left message  that CT head was normal. Needs to fu in 1 day otherwise go to ER prn   FINDINGS: No skull fracture is noted. No intracranial  hemorrhage, mass effect or midline shift. The gray and white-matter differentiation is preserved. No hydrocephalus. No intra or extra-axial fluid collection. No acute cortical infarction. No mass lesion is noted on this unenhanced scan.  IMPRESSION: No acute intracranial abnormality.   Electronically Signed  By: Natasha Mead M.D.  On: 06/29/2014 15:22

## 2014-06-29 NOTE — Patient Instructions (Signed)
Sertraline oral solution What is this medicine? SERTRALINE (SER tra leen) is used to treat depression. It may also be used to treat obsessive compulsive disorder, panic disorder, post-trauma stress, premenstrual dysphoric disorder (PMDD) or social anxiety. This medicine may be used for other purposes; ask your health care provider or pharmacist if you have questions. COMMON BRAND NAME(S): Zoloft What should I tell my health care provider before I take this medicine? They need to know if you have any of these conditions: -bipolar disorder or a family history of bipolar disorder -diabetes -glaucoma -heart disease -high blood pressure -history of irregular heartbeat -history of low levels of calcium, magnesium, or potassium in the blood -if you often drink alcohol -liver disease -receiving electroconvulsive therapy -seizures -suicidal thoughts, plans, or attempt; a previous suicide attempt by you or a family member -thyroid disease -an unusual or allergic reaction to sertraline, other medicines, foods, dyes, or preservatives -pregnant or trying to get pregnant -breast-feeding How should I use this medicine? Take this medicine by mouth. Follow the directions on the prescription label. Before taking your dose, you need to dilute the solution in a beverage. Measure your medicine dose using the dropper in the bottle. Next, place the measured dose in at least 4 ounces (one-half cup) of water, ginger-ale, lemon-lime soda, lemonade or orange juice and mix. Do not mix in grapefruit juice or in any other liquids other than those listed. Drink all of mixed liquid immediately. Do not mix the dose and store it for later. It must be taken right away. You may take this medicine with or without food. Take your medicine at regular intervals. Do not take your medicine more often than directed. Do not stop taking this medicine suddenly except upon the advice of your doctor. Stopping this medicine too quickly  may cause serious side effects or your condition may worsen. A special MedGuide will be given to you by the pharmacist with each prescription and refill. Be sure to read this information carefully each time. Talk to your pediatrician regarding the use of this medicine in children. While this drug may be prescribed for children as young as 7 years for selected conditions, precautions do apply. Overdosage: If you think you have taken too much of this medicine contact a poison control center or emergency room at once. NOTE: This medicine is only for you. Do not share this medicine with others. What if I miss a dose? If you miss a dose, take it as soon as you can. If it is almost time for your next dose, take only that dose. Do not take double or extra doses. What may interact with this medicine? Do not take this medicine with any of the following medications: -certain medicines for fungal infections like fluconazole, itraconazole, ketoconazole, posaconazole, voriconazole -cisapride -disulfiram -dofetilide -linezolid -MAOIs like Carbex, Eldepryl, Marplan, Nardil, and Parnate -metronidazole -methylene blue (injected into a vein) -pimozide -thioridazine -ziprasidone This medicine may also interact with the following medications: -alcohol -aspirin and aspirin-like medicines -certain medicines for depression, anxiety, or psychotic disturbances -certain medicines for irregular heart beat like flecainide, propafenone -certain medicines for migraine headaches like almotriptan, eletriptan, frovatriptan, naratriptan, rizatriptan, sumatriptan, zolmitriptan -certain medicines for sleep -certain medicines for seizures like carbamazepine, valproic acid, phenytoin -certain medicines that treat or prevent blood clots like warfarin, enoxaparin, dalteparin -cimetidine -digoxin -diuretics -fentanyl -furazolidone -isoniazid -lithium -NSAIDs, medicines for pain and inflammation, like ibuprofen or  naproxen -other medicines that prolong the QT interval (cause an abnormal heart rhythm) -procarbazine -rasagiline -  supplements like St. John's wort, kava kava, valerian -tolbutamide -tramadol -tryptophan This list may not describe all possible interactions. Give your health care provider a list of all the medicines, herbs, non-prescription drugs, or dietary supplements you use. Also tell them if you smoke, drink alcohol, or use illegal drugs. Some items may interact with your medicine. What should I watch for while using this medicine? Tell your doctor if your symptoms do not get better or if they get worse. Visit your doctor or health care professional for regular checks on your progress. Because it may take several weeks to see the full effects of this medicine, it is important to continue your treatment as prescribed by your doctor. Patients and their families should watch out for new or worsening thoughts of suicide or depression. Also watch out for sudden changes in feelings such as feeling anxious, agitated, panicky, irritable, hostile, aggressive, impulsive, severely restless, overly excited and hyperactive, or not being able to sleep. If this happens, especially at the beginning of treatment or after a change in dose, call your health care professional. You may get drowsy or dizzy. Do not drive, use machinery, or do anything that needs mental alertness until you know how this medicine affects you. Do not stand or sit up quickly, especially if you are an older patient. This reduces the risk of dizzy or fainting spells. Alcohol may interfere with the effect of this medicine. Avoid alcoholic drinks. This medicine contains a high percentage of alcohol that may interact with medicines used to treat alcohol abuse, like Antabuse (disulfiram). You should not take these medicines together. Your mouth may get dry. Chewing sugarless gum or sucking hard candy, and drinking plenty of water may help. Contact  your doctor if the problem does not go away or is severe. What side effects may I notice from receiving this medicine? Side effects that you should report to your doctor or health care professional as soon as possible: -allergic reactions like skin rash, itching or hives, swelling of the face, lips, or tongue -black or bloody stools, blood in the urine or vomit -fast, irregular heartbeat -feeling faint or lightheaded, falls -hallucination, loss of contact with reality -seizures -suicidal thoughts or other mood changes -unusual bleeding or bruising -unusually weak or tired -vomiting Side effects that usually do not require medical attention (report to your doctor or health care professional if they continue or are bothersome): -change in appetite -change in sex drive or performance -diarrhea -indigestion, nausea -increased sweating -tremors This list may not describe all possible side effects. Call your doctor for medical advice about side effects. You may report side effects to FDA at 1-800-FDA-1088. Where should I keep my medicine? Keep out of the reach of children. Store at room temperature between 15 and 30 degrees C (59 and 86 degrees F). Throw away any unused medicine after the expiration date. NOTE: This sheet is a summary. It may not cover all possible information. If you have questions about this medicine, talk to your doctor, pharmacist, or health care provider.  2015, Elsevier/Gold Standard. (2012-07-15 12:54:37)  

## 2014-06-30 ENCOUNTER — Ambulatory Visit (INDEPENDENT_AMBULATORY_CARE_PROVIDER_SITE_OTHER): Payer: BLUE CROSS/BLUE SHIELD | Admitting: Family Medicine

## 2014-06-30 VITALS — BP 118/58 | HR 76 | Temp 98.7°F | Resp 14 | Ht 72.5 in | Wt 180.4 lb

## 2014-06-30 DIAGNOSIS — F418 Other specified anxiety disorders: Secondary | ICD-10-CM

## 2014-06-30 DIAGNOSIS — I10 Essential (primary) hypertension: Secondary | ICD-10-CM | POA: Diagnosis not present

## 2014-06-30 DIAGNOSIS — F32A Depression, unspecified: Secondary | ICD-10-CM

## 2014-06-30 DIAGNOSIS — R42 Dizziness and giddiness: Secondary | ICD-10-CM | POA: Diagnosis not present

## 2014-06-30 DIAGNOSIS — F329 Major depressive disorder, single episode, unspecified: Secondary | ICD-10-CM

## 2014-06-30 DIAGNOSIS — E860 Dehydration: Secondary | ICD-10-CM

## 2014-06-30 DIAGNOSIS — F419 Anxiety disorder, unspecified: Secondary | ICD-10-CM

## 2014-06-30 LAB — DRUG SCREEN, URINE
Amphetamine Screen, Ur: NEGATIVE
Barbiturate Quant, Ur: NEGATIVE
Benzodiazepines.: NEGATIVE
Cocaine Metabolites: NEGATIVE
Creatinine,U: 178.38 mg/dL
Marijuana Metabolite: NEGATIVE
Methadone: NEGATIVE
Opiates: NEGATIVE
Phencyclidine (PCP): NEGATIVE
Propoxyphene: NEGATIVE

## 2014-06-30 LAB — B. BURGDORFI ANTIBODIES: B burgdorferi Ab IgG+IgM: 0.07 {ISR}

## 2014-06-30 NOTE — Patient Instructions (Signed)
Decrease her propranolol 80 mg to one half tablet daily, take her blood pressure.  Your blood pressure goal should be less than 140/90. Is greater than 140/90 then you need to start taking 80 mg daily. Call us. Flu this will help with her dizziness.  Continue with your Zoloft 100 mg twice daily  Follow-up with a psychiatrist. I have given you some resources. [facility] Located at [address], [place] [phone]  [facility] [phone]  [facility] [phone]

## 2014-06-30 NOTE — Progress Notes (Signed)
Chief Complaint:  Chief Complaint  Patient presents with  . Follow-up    HPI: Kyle Allen is a 44 y.o. male who is here for  recheck of his one episode of auditory hallucination and continuing dizziness. He still has some dizziness but feels overall slightly better. His urine drug screen and also all his labs were fairly unremarkable. His CT scan was negative. He is currently on propanolol 80 mg ER for anxiety and also hypertension. He does not drink a lot of fluids. He is taking his Zoloft medication as prescribed since yesterday and is feeling much better. Denies any suicidal, homicidal ideations. He still has some auditory hallucinations but they are not directing him to kill himself. He just hears people's conversations that they are not there.  Past Medical History  Diagnosis Date  . Allergy   . Anxiety   . Depression   . Substance abuse   . Hypertension    History reviewed. No pertinent past surgical history. History   Social History  . Marital Status: Single    Spouse Name: N/A  . Number of Children: N/A  . Years of Education: N/A   Social History Main Topics  . Smoking status: Current Every Day Smoker -- 1.00 packs/day for 20 years    Types: Cigarettes  . Smokeless tobacco: Not on file  . Alcohol Use: No  . Drug Use: No  . Sexual Activity: Not Currently   Other Topics Concern  . None   Social History Narrative   Marital status: single; not dating.  [place]       Children:  None       Lives: at [facility] since 07/2013.  Group home of 8 men in recovery.      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      Tobacco:  1 ppd x 20 years.  Never quit.      Alcohol: in recovery; drinking 1/5 per day; duration 20 years; DWIs x 5; last DWI 09/2009.  Gets license back this month; four years without license.      Drugs:  Marijuana, prescription pills (pain medications Oxycontin, Vicodin, some  benzos).  No heroine or cocaine.      Exercise:  Six months; joined [employer]; cardio to start; free weights   Family History  Problem Relation Age of Onset  . Hyperlipidemia Mother   . Hypertension Mother   . Hyperlipidemia Father   . Hypertension Father   . Hyperlipidemia Brother   . Hypertension Brother   . Cancer Maternal Grandmother   . Hyperlipidemia Maternal Grandmother   . Hypertension Maternal Grandmother   . Hyperlipidemia Maternal Grandfather   . Hypertension Maternal Grandfather   . Diabetes Maternal Grandfather   . Hyperlipidemia Paternal Grandmother   . Hypertension Paternal Grandmother   . Hyperlipidemia Paternal Grandfather   . Hypertension Paternal Grandfather   . Heart disease Paternal Grandfather    No Known Allergies Prior to Admission medications   Medication Sig Start Date End Date Taking? Authorizing Provider  propranolol ER (INDERAL LA) 80 MG 24 hr capsule Take 1 capsule (80 mg total) by mouth daily. 03/10/14  Yes Ethelda ChickKristi M Smith, MD  traZODone (DESYREL) 50 MG tablet TAKE 1 TABLET BY MOUTH AT BEDTIME AS NEEDED FOR SLEEP 06/04/14  Yes Ethelda ChickKristi M Smith, MD  fluticasone [facility](FLONASE) 50 MCG/ACT nasal spray Place 2 sprays into both nostrils daily. Patient not taking: Reported on  06/30/2014 03/29/14   Ethelda Chick, MD  propranolol ER (INDERAL LA) 80 MG 24 hr capsule TAKE 1 CAPSULE BY MOUTH DAILY Patient not taking: Reported on 06/29/2014 04/07/14   Ethelda Chick, MD  sertraline (ZOLOFT) 100 MG tablet Take 1-2 tablets (100-200 mg total) by mouth daily. Patient not taking: Reported on 06/29/2014 06/02/14   Ethelda Chick, MD  sertraline (ZOLOFT) 100 MG tablet Take 1 tablet (100 mg total) by mouth 2 (two) times daily. Patient not taking: Reported on 06/30/2014 06/29/14   Thao P Le, DO  valACYclovir (VALTREX) 1000 MG tablet Take 2 tablets (2,000 mg total) by mouth 2 (two) times daily. Patient not taking: Reported on 06/29/2014 06/02/14   Ethelda Chick, MD  valACYclovir (VALTREX)  1000 MG tablet Take 1 tablet (1,000 mg total) by mouth 3 (three) times daily. Patient not taking: Reported on 06/29/2014 06/24/14   Ethelda Chick, MD     ROS: The patient denies fevers, chills, night sweats, unintentional weight loss, chest pain, palpitations, wheezing, dyspnea on exertion, nausea, vomiting, abdominal pain, dysuria, hematuria, melena, numbness, weakness, or tingling.   All other systems have been reviewed and were otherwise negative with the exception of those mentioned in the HPI and as above.    PHYSICAL EXAM: Filed Vitals:   06/30/14 1051  BP: 118/58  Pulse: 76  Temp: 98.7 F (37.1 C)  Resp: 14   Filed Vitals:   06/30/14 1051  Height: 6' 0.5" (1.842 m)  Weight: 180 lb 6.4 oz (81.829 kg)   Body mass index is 24.12 kg/(m^2).   General: Alert, no acute distress HEENT:  Normocephalic, atraumatic, oropharynx patent. EOMI, PERRLA, fundoscopic exam normal, dry oral mucosa Cardiovascular:  Regular rate and rhythm, no rubs murmurs or gallops.  No Carotid bruits, radial pulse intact. No pedal edema.  Respiratory: Clear to auscultation bilaterally.  No wheezes, rales, or rhonchi.  No cyanosis, no use of accessory musculature GI: No organomegaly, abdomen is soft and non-tender, positive bowel sounds.  No masses. Skin: No rashes. Neurologic: Facial musculature symmetric. Cranial nerves II-12 grossly intact Psychiatric: Patient is appropriate throughout our interaction. Lymphatic: No cervical lymphadenopathy Musculoskeletal: Gait intact. 5 out of 5 strength, 2 out of 2 DTRs.   LABS: Results for orders placed or performed in visit on 06/29/14  COMPLETE METABOLIC PANEL WITH GFR  Result Value Ref Range   Sodium 144 135 - 145 mEq/L   Potassium 3.5 3.5 - 5.3 mEq/L   Chloride 111 96 - 112 mEq/L   CO2 17 (L) 19 - 32 mEq/L   Glucose, Bld 86 70 - 99 mg/dL   BUN 20 6 - 23 mg/dL   Creat 1.61 0.96 - 0.45 mg/dL   Total Bilirubin 0.2 0.2 - 1.2 mg/dL   Alkaline Phosphatase 27  (L) 39 - 117 U/L   AST 30 0 - 37 U/L   ALT 31 0 - 53 U/L   Total Protein 6.4 6.0 - 8.3 g/dL   Albumin 4.3 3.5 - 5.2 g/dL   Calcium 8.3 (L) 8.4 - 10.5 mg/dL   GFR, Est African American >89 mL/min   GFR, Est Non African American 86 mL/min  TSH  Result Value Ref Range   TSH 0.524 0.350 - 4.500 uIU/mL  B. Burgdorfi Antibodies  Result Value Ref Range   B burgdorferi Ab IgG+IgM 0.07 ISR  Drug Screen, Urine  Result Value Ref Range   Benzodiazepines. NEG Negative   Phencyclidine (PCP) NEG Negative   Cocaine  Metabolites NEG Negative   Amphetamine Screen, Ur NEG Negative   Marijuana Metabolite NEG Negative   Opiates NEG Negative   Barbiturate Quant, Ur NEG Negative   Methadone NEG Negative   Propoxyphene NEG Negative   Creatinine,U 178.38 mg/dL  POCT CBC  Result Value Ref Range   WBC 10.9 (A) 4.6 - 10.2 K/uL   Lymph, poc 1.5 0.6 - 3.4   POC LYMPH PERCENT 14.1 10 - 50 %L   MID (cbc) 0.5 0 - 0.9   POC MID % 5.0 0 - 12 %M   POC Granulocyte 8.8 (A) 2 - 6.9   Granulocyte percent 80.9 (A) 37 - 80 %G   RBC 4.09 (A) 4.69 - 6.13 M/uL   Hemoglobin 13.1 (A) 14.1 - 18.1 g/dL   HCT, POC 47.8 (A) 29.5 - 53.7 %   MCV 98.0 (A) 80 - 97 fL   MCH, POC 32.0 (A) 27 - 31.2 pg   MCHC 32.6 31.8 - 35.4 g/dL   RDW, POC 62.1 %   Platelet Count, POC 253 142 - 424 K/uL   MPV 6.1 0 - 99.8 fL  POCT UA - Microscopic Only  Result Value Ref Range   WBC, Ur, HPF, POC 1-3    RBC, urine, microscopic 0-2    Bacteria, U Microscopic trace    Mucus, UA positive    Epithelial cells, urine per micros 0-1    Crystals, Ur, HPF, POC neg    Casts, Ur, LPF, POC neg    Yeast, UA neg   POCT urinalysis dipstick  Result Value Ref Range   Color, UA yellow    Clarity, UA hazy    Glucose, UA neg    Bilirubin, UA neg    Ketones, UA 15    Spec Grav, UA 1.025    Blood, UA neg    pH, UA 5.5    Protein, UA 100    Urobilinogen, UA 0.2    Nitrite, UA neg    Leukocytes, UA Negative Negative  POCT glucose (manual entry)    Result Value Ref Range   POC Glucose 87 70 - 99 mg/dl     EKG/XRAY:   Primary read interpreted by Dr. Conley Rolls at [facility].   ASSESSMENT/PLAN: Encounter Diagnoses  Name Primary?  . Dizziness and giddiness Yes  . Dehydration   . Essential hypertension   . Anxiety and depression    Mr. Joellyn Quails is a 44 year old male with a past medical history of substance abuse who has been sober for the last year. He has had some acute issues with auditory hallucinations and dizziness but is not suicidal or homicidal. He is in a drug rehabilitation facility and has had negative drug screens. Suspect that his symptoms are related to a rapid withdrawal from Zoloft. He was on 200 mg of Zoloft and then come abruptly stopped taking it. He also is on high doses of propranolol for hypertension and anxiety. He does not drink fluids very much. This may be a combination of dehydration and antihypertensives medicines.  Decrease propranolol 80 mg to one half tablet ( 40 mg) daily, take blood pressure. Blood pressure measurements  next 2 weeks. Bring blood pressure logs in at next visit. Your blood pressure goal should be less than 140/90. Is greater than 140/90 then you need to start taking 80 mg daily. Call us.  He was given 1 L of IV fluids and felt better  Continue with your Zoloft 100 mg twice daily  Follow-up with a  psychiatrist. I have given you some resources. [facility] Located at [address], [place] [phone]  [facility]  Behavioral Health [phone]  [facility] [phone]  History given precautions that if he has any suicidal/homicidal ideations or if the auditory hallucinations or any new hallucinations occur he needs to seek help at the [facility] ER.  Gross sideeffects, risk and benefits, and alternatives of medications d/w patient. Patient is aware that all medications have potential sideeffects and we are unable to predict every sideeffect or  drug-drug interaction that may occur.  LE, THAO PHUONG, DO 06/30/2014 3:50 PM

## 2014-07-05 ENCOUNTER — Encounter: Payer: Self-pay | Admitting: Family Medicine

## 2014-07-07 ENCOUNTER — Telehealth: Payer: Self-pay | Admitting: Family Medicine

## 2014-07-07 DIAGNOSIS — R030 Elevated blood-pressure reading, without diagnosis of hypertension: Principal | ICD-10-CM

## 2014-07-07 DIAGNOSIS — IMO0001 Reserved for inherently not codable concepts without codable children: Secondary | ICD-10-CM

## 2014-07-07 MED ORDER — PROPRANOLOL HCL ER 80 MG PO CP24: 80.0000 mg | ORAL_CAPSULE | Freq: Every day | ORAL | Status: DC

## 2014-07-07 NOTE — Telephone Encounter (Signed)
Mr Kyle Allen is doing much better, he states he has been checking bP by CNAs at work but after he ahs been walking and BP has been 150-170s.90s. He is on propranolol LA 80 mg daily in AM. I have advised him to go get bp cuff and record BP and HR for reviewe on 07/14/14 appt. He will cont with propranolol LA 80 mg daily. Minimal pedal edema after long day at work. No SOB or CP

## 2014-07-13 ENCOUNTER — Encounter: Payer: Self-pay | Admitting: Family Medicine

## 2014-07-14 ENCOUNTER — Ambulatory Visit: Payer: BLUE CROSS/BLUE SHIELD | Admitting: Physician Assistant

## 2014-08-04 ENCOUNTER — Encounter: Payer: Self-pay | Admitting: Family Medicine

## 2014-08-04 ENCOUNTER — Ambulatory Visit (INDEPENDENT_AMBULATORY_CARE_PROVIDER_SITE_OTHER): Payer: BLUE CROSS/BLUE SHIELD | Admitting: Family Medicine

## 2014-08-04 VITALS — BP 118/68 | HR 74 | Temp 98.4°F | Resp 16 | Ht 71.5 in | Wt 174.6 lb

## 2014-08-04 DIAGNOSIS — E860 Dehydration: Secondary | ICD-10-CM | POA: Diagnosis not present

## 2014-08-04 DIAGNOSIS — R410 Disorientation, unspecified: Secondary | ICD-10-CM | POA: Diagnosis not present

## 2014-08-04 DIAGNOSIS — F411 Generalized anxiety disorder: Secondary | ICD-10-CM

## 2014-08-04 DIAGNOSIS — I10 Essential (primary) hypertension: Secondary | ICD-10-CM

## 2014-08-04 LAB — POCT URINALYSIS DIPSTICK
Bilirubin, UA: NEGATIVE
GLUCOSE UA: NEGATIVE
Ketones, UA: NEGATIVE
Leukocytes, UA: NEGATIVE
Nitrite, UA: NEGATIVE
Protein, UA: NEGATIVE
RBC UA: NEGATIVE
Spec Grav, UA: 1.015
Urobilinogen, UA: 0.2
pH, UA: 5.5

## 2014-08-04 MED ORDER — ESCITALOPRAM OXALATE 10 MG PO TABS: 10.0000 mg | ORAL_TABLET | Freq: Every day | ORAL | Status: DC

## 2014-08-04 NOTE — Progress Notes (Signed)
Subjective:    Patient ID: Kyle Allen, male    DOB: Jul 16, 1970, 44 y.o.   MRN: [id number]  08/04/2014  Follow-up and Depression   HPI This 44 y.o. male presents for evaluation of    1.  Altered mental status:  Never made it to work.  Woke up Saturday morning and did not know where he was.  Had worked on Friday and was really tired.  Looked up and did not recognize anything.  Around 6:30am; knew to call Mom. Opened front door; saw roommate and everything clicked.  Thought was still dreaming or sleep walking.  Then layed around Saturday; does not recall how Casimiro Needle got involved; parents called Casimiro Needle who is a friend who lives in [place].  All family in [place].  Severely dehydrated.  Gave water and iv fluids.  Stop drinking Mt. Dews.  Now drinking just one. Was seeing people; out of corner of eye, would see someone walk by and would look and no one would be there. Heard conversations; could not understand conversation; would look back and not see anyone.  Had abruptly stop the Zoloft two weeks prior.  Initially evaluated by [facility] Dr. Conley Rolls.  Sent to ED; s/; CT scan negative.  Was very light-headed and dizzy.   S/p UDS by roommates; s/p breathalizer negative.  Since 06/30/2014 much better.  Cannot hear anything in large rooms; acoustics are horrible; must read lips.  Still hearing things; whispering.  Ringing in ears.  Piercing.  Duration for a year.  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    2.  HTN:  takign Propanolol one at am.  Bought BP machine; started checking it every morning.  BP is so random.  Recommended taking two hours after after taking BP medication.  No consistent readings.  Drinking lots of fluids today; waters x 3 bottles.  Doing landscaping in thick shirts.  Drinking a lot water but losing more sweat.  Drinking gatorade but causes heartburn.  Taking heartburn medication.  3.  Anxiety: stopped Zoloft in 06/30/14; still nervous; taking 2 Zoloft.  Always asking who will be there;  who is going to be there.  Dreads meeting.  No specific dislike; does not like surprises.  Prozac; then switched to Zoloft.  Headache all the time occipital region.  Has not called yet for psychiatry appointment.  Previous evaluation by Dr. Tomasa Rand who diagnosed with bipolar.  Aunt has been on Zoloft forever.  Mother is actress.    Review of Systems  Constitutional: Negative for fever, chills, diaphoresis, activity change, appetite change and fatigue.  Eyes: Negative for visual disturbance.  Respiratory: Negative for cough and shortness of breath.   Cardiovascular: Negative for chest pain, palpitations and leg swelling.  Endocrine: Negative for cold intolerance, heat intolerance, polydipsia, polyphagia and polyuria.  Neurological: Negative for dizziness, tremors, seizures, syncope, facial asymmetry, speech difficulty, weakness, light-headedness, numbness and headaches.  Psychiatric/Behavioral: Negative for suicidal ideas, sleep disturbance, self-injury and dysphoric mood. The patient is nervous/anxious.     Past Medical History  Diagnosis Date  . Allergy   . Anxiety   . Depression   . Substance abuse   . Hypertension    History reviewed. No pertinent past surgical history. No Known Allergies  Social History   Social History  . Marital Status: Single    Spouse Name: N/A  . Number of Children: N/A  . Years of Education: N/A   Occupational History  . Not on file.   Social History Main Topics  .  Smoking status: Current Every Day Smoker -- 1.00 packs/day for 20 years    Types: Cigarettes  . Smokeless tobacco: Not on file  . Alcohol Use: No  . Drug Use: No  . Sexual Activity: Not Currently   Other Topics Concern  . Not on file   Social History Narrative   Marital status: single; not dating.  From [place], [place].       Children:  None       Lives: at [facility] since 07/2013.  Group home of 8 men in recovery.      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      Tobacco:  1 ppd x 20 years.  Never quit.      Alcohol: in recovery; drinking 1/5 per day; duration 20 years; DWIs x 5; last DWI 09/2009.  Gets license back this month; four years without license.      Drugs:  Marijuana, prescription pills (pain medications Oxycontin, Vicodin, some benzos).  No heroine or cocaine.      Exercise:  Six months; joined [employer]; cardio to start; free weights   Family History  Problem Relation Age of Onset  . Hyperlipidemia Mother   . Hypertension Mother   . Hyperlipidemia Father   . Hypertension Father   . Hyperlipidemia Brother   . Hypertension Brother   . Cancer Maternal Grandmother   . Hyperlipidemia Maternal Grandmother   . Hypertension Maternal Grandmother   . Hyperlipidemia Maternal Grandfather   . Hypertension Maternal Grandfather   . Diabetes Maternal Grandfather   . Hyperlipidemia Paternal Grandmother   . Hypertension Paternal Grandmother   . Hyperlipidemia Paternal Grandfather   . Hypertension Paternal Grandfather   . Heart disease Paternal Grandfather        Objective:    BP 118/68 mmHg  Pulse 74  Temp(Src) 98.4 F (36.9 C) (Oral)  Resp 16  Ht 5' 11.5" (1.816 m)  Wt 174 lb 9.6 oz (79.198 kg)  BMI 24.01 kg/m2 Physical Exam  Constitutional: He is oriented to person, place, and time. He appears well-developed and well-nourished. No distress.  HENT:  Head: Normocephalic and atraumatic.  Right Ear: External ear normal.  Left Ear: External ear normal.  Nose: Nose normal.  Mouth/Throat: Oropharynx is clear and moist.  Eyes: Conjunctivae and EOM are normal. Pupils are equal, round, and reactive to light.  Neck: Normal range of motion. Neck supple. Carotid bruit is not present. No thyromegaly present.  Cardiovascular: Normal rate, regular rhythm, normal heart sounds and intact distal pulses.  Exam reveals no gallop and no friction rub.   No murmur heard. Pulmonary/Chest: Effort  normal and breath sounds normal. He has no wheezes. He has no rales.  Abdominal: Soft. Bowel sounds are normal. He exhibits no distension and no mass. There is no tenderness. There is no rebound and no guarding.  Lymphadenopathy:    He has no cervical adenopathy.  Neurological: He is alert and oriented to person, place, and time. No cranial nerve deficit. He exhibits normal muscle tone. Coordination normal.  Skin: Skin is warm and dry. No rash noted. He is not diaphoretic.  Psychiatric: He has a normal mood and affect. His behavior is normal. Judgment and thought content normal. He is not withdrawn and not actively hallucinating. Thought content is not paranoid. He expresses no homicidal and no suicidal ideation. He expresses no suicidal plans and no homicidal plans. He is attentive.  Nursing note and vitals reviewed.  Results for orders placed or performed in visit on 08/04/14  POCT urinalysis dipstick  Result Value Ref Range   Color, UA yellow    Clarity, UA clear    Glucose, UA neg    Bilirubin, UA neg    Ketones, UA neg    Spec Grav, UA 1.015    Blood, UA neg    pH, UA 5.5    Protein, UA neg    Urobilinogen, UA 0.2    Nitrite, UA neg    Leukocytes, UA Negative Negative       Assessment & Plan:   1. Dehydration   2. Generalized anxiety disorder   3. Essential hypertension, benign     1. Dehydration: Improved; prolonged heat exposure with work during the summer.  Recent altered mental status secondary to dehydration.  Normal neurological exam in office. 2. Generalized anxiety disorder: uncontrolled; wean Zoloft over upcoming month; start Lexapro  daily for one month and then increase to two daily; refer to psychiatry. 3.  HTN: stable today with Propanolol.   4. Altered mental status: now resolved; felt secondary to dehydration.   Meds ordered this encounter  Medications  . escitalopram (LEXAPRO) 10 MG tablet    Sig: Take 1-2 tablets (10-20 mg total) by mouth daily.     Dispense:  60 tablet    Refill:  5    Return in about 2 months (around 10/04/2014) for recheck.     Seyon Strader Paulita Fujita, M.D. [facility] [address] [place], [place]  [address] [phone] phone [phone] fax

## 2014-08-04 NOTE — Patient Instructions (Signed)
1. DECREASE ZOLOFT  TO ONE TABLET DAILY FOR TWO WEEKS; THEN DECREASE TO 1/2 TABLET DAILY FOR TWO WEEKS AND THEN STOP. 2.  START LEXAPRO  ONE TABLET DAILY FOR ONE MONTH; THEN INCREASE TO TWO TABLETS DAILY. 3.  CALL PSYCHIATRY OFFICE FOR APPOINTMENT.

## 2014-10-11 ENCOUNTER — Ambulatory Visit: Payer: BLUE CROSS/BLUE SHIELD | Admitting: Family Medicine

## 2014-11-07 ENCOUNTER — Other Ambulatory Visit: Payer: Self-pay | Admitting: Family Medicine

## 2014-11-17 ENCOUNTER — Encounter: Payer: Self-pay | Admitting: Family Medicine

## 2014-11-22 ENCOUNTER — Encounter: Payer: Self-pay | Admitting: Family Medicine

## 2014-11-22 ENCOUNTER — Ambulatory Visit (INDEPENDENT_AMBULATORY_CARE_PROVIDER_SITE_OTHER): Payer: BLUE CROSS/BLUE SHIELD | Admitting: Family Medicine

## 2014-11-22 VITALS — BP 103/56 | HR 62 | Temp 98.2°F | Resp 16 | Ht 72.0 in | Wt 172.8 lb

## 2014-11-22 DIAGNOSIS — S00521A Blister (nonthermal) of lip, initial encounter: Secondary | ICD-10-CM | POA: Diagnosis not present

## 2014-11-22 DIAGNOSIS — F419 Anxiety disorder, unspecified: Secondary | ICD-10-CM | POA: Diagnosis not present

## 2014-11-22 DIAGNOSIS — G4700 Insomnia, unspecified: Secondary | ICD-10-CM

## 2014-11-22 DIAGNOSIS — R030 Elevated blood-pressure reading, without diagnosis of hypertension: Secondary | ICD-10-CM | POA: Diagnosis not present

## 2014-11-22 DIAGNOSIS — F17200 Nicotine dependence, unspecified, uncomplicated: Secondary | ICD-10-CM

## 2014-11-22 DIAGNOSIS — Z720 Tobacco use: Secondary | ICD-10-CM

## 2014-11-22 DIAGNOSIS — F329 Major depressive disorder, single episode, unspecified: Secondary | ICD-10-CM | POA: Diagnosis not present

## 2014-11-22 DIAGNOSIS — IMO0001 Reserved for inherently not codable concepts without codable children: Secondary | ICD-10-CM

## 2014-11-22 DIAGNOSIS — K130 Diseases of lips: Secondary | ICD-10-CM

## 2014-11-22 DIAGNOSIS — F32A Depression, unspecified: Secondary | ICD-10-CM

## 2014-11-22 MED ORDER — PROPRANOLOL HCL ER 60 MG PO CP24: 60.0000 mg | ORAL_CAPSULE | Freq: Every day | ORAL | Status: DC

## 2014-11-22 MED ORDER — ESCITALOPRAM OXALATE 20 MG PO TABS: 20.0000 mg | ORAL_TABLET | Freq: Every day | ORAL | Status: DC

## 2014-11-22 MED ORDER — VALACYCLOVIR HCL 500 MG PO TABS: 500.0000 mg | ORAL_TABLET | Freq: Three times a day (TID) | ORAL | Status: DC

## 2014-11-22 NOTE — Progress Notes (Signed)
Subjective:    Patient ID: Kyle Allen, male    DOB: 11/06/1970, 44 y.o.   MRN: [id number]  11/22/2014  follow up bp and Medication Refill   HPI This 44 y.o. male presents for three month follow-up:  1.  HSV labialis:  Onset two months ago; duration two months; needs refill of Valtrex.  Went to dermatologist.  Has blisters and lips.  No biopsy; returns in 30 days. Felt all around; did not think it was cancer.  Recommended stopping Tylenol to rule out allergic reaction.  Dried blood down mouth.  Must get hot was cloth to remove blood.  Really severe this time.  Started with classic blisters.  Will dry out; just talking it will crack.  With eating, cracks.  Definitely better with using topical steroid cream prescribed by dermatologist; 50% improved but now more painful.  Burning pain.  Throbs; feels heartbeat in lip.  2 . Anxiety and depression:  Patient reports good compliance with medication, good tolerance to medication, and good symptom control.   Five other NA guys living with patient; no ambition; Never home; gets to work at 6:00am; then [facility] to [facility] and then [place]; has own room.  Did have a roommate but now has roommate.  Accuses of isolating.  Could not find paperwork of psychiatrist and counselor.  Taking Lexapro 10mg  two daily.    3.  Alcoholism: going to AA several times per week; NA meetings are horrible.  Has sponsor.   4.  Spider bites: possible since moving to [place].  Brother with brown recluse spider L arm.  TDAP 2015.  Localized swelling and drainage.  Scar on arm;similar bite L thigh.   Review of Systems  Constitutional: Negative for fever, chills, diaphoresis, activity change, appetite change and fatigue.  Eyes: Negative for visual disturbance.  Respiratory: Negative for cough and shortness of breath.   Cardiovascular: Negative for chest pain, palpitations and leg swelling.  Endocrine: Negative for cold intolerance, heat intolerance, polydipsia,  polyphagia and polyuria.  Neurological: Negative for dizziness, tremors, seizures, syncope, facial asymmetry, speech difficulty, weakness, light-headedness, numbness and headaches.    Past Medical History  Diagnosis Date  . Allergy   . Anxiety   . Depression   . Substance abuse   . Hypertension    No past surgical history on file. Not on File Current Outpatient Prescriptions  Medication Sig Dispense Refill  . escitalopram (LEXAPRO) 20 MG tablet Take 1 tablet (20 mg total) by mouth at bedtime. 30 tablet 5  . fluticasone (FLONASE) 50 MCG/ACT nasal spray Place 2 sprays into both nostrils daily. 16 g 11  . propranolol ER (INDERAL LA) 60 MG 24 hr capsule Take 1 capsule (60 mg total) by mouth daily. 30 capsule 5  . traZODone (DESYREL) 50 MG tablet TAKE 1 TABLET BY MOUTH AT BEDTIME AS NEEDED FOR SLEEP 30 tablet 5  . clobetasol ointment (TEMOVATE) 0.05 % APP TO LIP TID  0  . mupirocin ointment (BACTROBAN) 2 % APP EXT AA TID  0  . valACYclovir (VALTREX) 500 MG tablet Take 1 tablet (500 mg total) by mouth 3 (three) times daily. 21 tablet 5   No current facility-administered medications for this visit.   Social History   Social History  . Marital Status: Single    Spouse Name: N/A  . Number of Children: N/A  . Years of Education: N/A   Occupational History  . Not on file.   Social History Main Topics  .  Smoking status: Current Every Day Smoker -- 1.00 packs/day for 20 years    Types: Cigarettes  . Smokeless tobacco: Not on file  . Alcohol Use: No  . Drug Use: No  . Sexual Activity: Not Currently   Other Topics Concern  . Not on file   Social History Narrative   Marital status: single; not dating.  From [place], [place].       Children:  None       Lives: at [facility] since 07/2013.  Group home of 8 men in recovery.      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      Tobacco:  1 ppd x 20 years.  Never quit.       Alcohol: in recovery; drinking 1/5 per day; duration 20 years; DWIs x 5; last DWI 09/2009.  Gets license back this month; four years without license.      Drugs:  Marijuana, prescription pills (pain medications Oxycontin, Vicodin, some benzos).  No heroine or cocaine.      Exercise:  Six months; joined [employer]; cardio to start; free weights   Family History  Problem Relation Age of Onset  . Hyperlipidemia Mother   . Hypertension Mother   . Hyperlipidemia Father   . Hypertension Father   . Hyperlipidemia Brother   . Hypertension Brother   . Cancer Maternal Grandmother   . Hyperlipidemia Maternal Grandmother   . Hypertension Maternal Grandmother   . Hyperlipidemia Maternal Grandfather   . Hypertension Maternal Grandfather   . Diabetes Maternal Grandfather   . Hyperlipidemia Paternal Grandmother   . Hypertension Paternal Grandmother   . Hyperlipidemia Paternal Grandfather   . Hypertension Paternal Grandfather   . Heart disease Paternal Grandfather        Objective:    BP 103/56 mmHg  Pulse 62  Temp(Src) 98.2 F (36.8 C) (Oral)  Resp 16  Ht 6' (1.829 m)  Wt 172 lb 12.8 oz (78.382 kg)  BMI 23.43 kg/m2 Physical Exam  Constitutional: He is oriented to person, place, and time. He appears well-developed and well-nourished. No distress.  HENT:  Head: Normocephalic and atraumatic.  Right Ear: External ear normal.  Left Ear: External ear normal.  Nose: Nose normal.  Mouth/Throat: Oropharynx is clear and moist.  Eyes: Conjunctivae and EOM are normal. Pupils are equal, round, and reactive to light.  Neck: Normal range of motion. Neck supple. Carotid bruit is not present. No thyromegaly present.  Cardiovascular: Normal rate, regular rhythm, normal heart sounds and intact distal pulses.  Exam reveals no gallop and no friction rub.   No murmur heard. Pulmonary/Chest: Effort normal and breath sounds normal. He has no wheezes. He has no rales.  Abdominal: Soft. Bowel sounds are  normal. He exhibits no distension and no mass. There is no tenderness. There is no rebound and no guarding.  Lymphadenopathy:    He has no cervical adenopathy.  Neurological: He is alert and oriented to person, place, and time. No cranial nerve deficit.  Skin: Skin is warm and dry. No rash noted. He is not diaphoretic.  Psychiatric: He has a normal mood and affect. His behavior is normal.  Nursing note and vitals reviewed.  Results for orders placed or performed in visit on 08/04/14  POCT urinalysis dipstick  Result Value Ref Range   Color, UA yellow    Clarity, UA clear    Glucose, UA neg    Bilirubin, UA neg  Ketones, UA neg    Spec Grav, UA 1.015    Blood, UA neg    pH, UA 5.5    Protein, UA neg    Urobilinogen, UA 0.2    Nitrite, UA neg    Leukocytes, UA Negative Negative       Assessment & Plan:   1. Depression   2. Insomnia   3. Smoker   4. Anxiety   5. Blister of lip without infection, initial encounter   6. Lesion of lip   7. Blood pressure elevated     No orders of the defined types were placed in this encounter.   Meds ordered this encounter  Medications  . valACYclovir (VALTREX) 500 MG tablet    Sig: Take 1 tablet (500 mg total) by mouth 3 (three) times daily.    Dispense:  21 tablet    Refill:  5  . mupirocin ointment (BACTROBAN) 2 %    Sig: APP EXT AA TID    Refill:  0  . clobetasol ointment (TEMOVATE) 0.05 %    Sig: APP TO LIP TID    Refill:  0  . propranolol ER (INDERAL LA) 60 MG 24 hr capsule    Sig: Take 1 capsule (60 mg total) by mouth daily.    Dispense:  30 capsule    Refill:  5  . escitalopram (LEXAPRO) 20 MG tablet    Sig: Take 1 tablet (20 mg total) by mouth at bedtime.    Dispense:  30 tablet    Refill:  5    Return in about 3 months (around 02/22/2015) for recheck.    Milos Milligan Paulita Fujita, M.D. [facility] [address] [place], [place]  [address] [phone] phone [phone] fax

## 2014-12-10 ENCOUNTER — Encounter: Payer: Self-pay | Admitting: Family Medicine

## 2014-12-27 ENCOUNTER — Other Ambulatory Visit: Payer: Self-pay | Admitting: Family Medicine

## 2015-01-17 ENCOUNTER — Telehealth: Payer: Self-pay

## 2015-01-17 NOTE — Telephone Encounter (Signed)
Left voicemail informing patient of appointment change,mailed letter AJ  °

## 2015-01-22 ENCOUNTER — Other Ambulatory Visit: Payer: Self-pay | Admitting: Family Medicine

## 2015-02-17 ENCOUNTER — Other Ambulatory Visit: Payer: Self-pay | Admitting: Family Medicine

## 2015-02-25 ENCOUNTER — Ambulatory Visit: Payer: BLUE CROSS/BLUE SHIELD | Admitting: Family Medicine

## 2015-03-08 ENCOUNTER — Ambulatory Visit (INDEPENDENT_AMBULATORY_CARE_PROVIDER_SITE_OTHER): Payer: BLUE CROSS/BLUE SHIELD

## 2015-03-08 ENCOUNTER — Encounter: Payer: Self-pay | Admitting: Family Medicine

## 2015-03-08 ENCOUNTER — Ambulatory Visit (INDEPENDENT_AMBULATORY_CARE_PROVIDER_SITE_OTHER): Payer: BLUE CROSS/BLUE SHIELD | Admitting: Family Medicine

## 2015-03-08 VITALS — BP 114/62 | HR 71 | Temp 98.9°F | Resp 16 | Ht 72.0 in | Wt 166.0 lb

## 2015-03-08 DIAGNOSIS — R030 Elevated blood-pressure reading, without diagnosis of hypertension: Secondary | ICD-10-CM

## 2015-03-08 DIAGNOSIS — F1910 Other psychoactive substance abuse, uncomplicated: Secondary | ICD-10-CM | POA: Diagnosis not present

## 2015-03-08 DIAGNOSIS — R634 Abnormal weight loss: Secondary | ICD-10-CM

## 2015-03-08 DIAGNOSIS — B001 Herpesviral vesicular dermatitis: Secondary | ICD-10-CM | POA: Diagnosis not present

## 2015-03-08 DIAGNOSIS — F32A Depression, unspecified: Secondary | ICD-10-CM

## 2015-03-08 DIAGNOSIS — F418 Other specified anxiety disorders: Secondary | ICD-10-CM

## 2015-03-08 DIAGNOSIS — G4700 Insomnia, unspecified: Secondary | ICD-10-CM

## 2015-03-08 DIAGNOSIS — F329 Major depressive disorder, single episode, unspecified: Secondary | ICD-10-CM

## 2015-03-08 DIAGNOSIS — F419 Anxiety disorder, unspecified: Principal | ICD-10-CM

## 2015-03-08 DIAGNOSIS — I10 Essential (primary) hypertension: Secondary | ICD-10-CM | POA: Diagnosis not present

## 2015-03-08 DIAGNOSIS — K130 Diseases of lips: Secondary | ICD-10-CM | POA: Diagnosis not present

## 2015-03-08 DIAGNOSIS — Z720 Tobacco use: Secondary | ICD-10-CM | POA: Diagnosis not present

## 2015-03-08 DIAGNOSIS — F1911 Other psychoactive substance abuse, in remission: Secondary | ICD-10-CM

## 2015-03-08 LAB — CBC WITH DIFFERENTIAL/PLATELET
BASOS PCT: 1 % (ref 0–1)
Basophils Absolute: 0.1 10*3/uL (ref 0.0–0.1)
EOS ABS: 0.3 10*3/uL (ref 0.0–0.7)
Eosinophils Relative: 4 % (ref 0–5)
HCT: 27.4 % — ABNORMAL LOW (ref 39.0–52.0)
HEMOGLOBIN: 8.8 g/dL — AB (ref 13.0–17.0)
Lymphocytes Relative: 34 % (ref 12–46)
Lymphs Abs: 2.3 10*3/uL (ref 0.7–4.0)
MCH: 28.7 pg (ref 26.0–34.0)
MCHC: 32.1 g/dL (ref 30.0–36.0)
MCV: 89.3 fL (ref 78.0–100.0)
MPV: 8.2 fL — ABNORMAL LOW (ref 8.6–12.4)
Monocytes Absolute: 0.6 10*3/uL (ref 0.1–1.0)
Monocytes Relative: 9 % (ref 3–12)
NEUTROS ABS: 3.6 10*3/uL (ref 1.7–7.7)
NEUTROS PCT: 52 % (ref 43–77)
PLATELETS: 312 10*3/uL (ref 150–400)
RBC: 3.07 MIL/uL — AB (ref 4.22–5.81)
RDW: 15.5 % (ref 11.5–15.5)
WBC: 6.9 10*3/uL (ref 4.0–10.5)

## 2015-03-08 LAB — POCT URINALYSIS DIP (MANUAL ENTRY)
BILIRUBIN UA: NEGATIVE
BILIRUBIN UA: NEGATIVE
GLUCOSE UA: NEGATIVE
NITRITE UA: NEGATIVE
Protein Ur, POC: NEGATIVE
Spec Grav, UA: 1.025
Urobilinogen, UA: 0.2
pH, UA: 5.5

## 2015-03-08 LAB — POC MICROSCOPIC URINALYSIS (UMFC): Mucus: ABSENT

## 2015-03-08 LAB — T4, FREE: FREE T4: 0.7 ng/dL — AB (ref 0.8–1.8)

## 2015-03-08 LAB — TSH: TSH: 0.83 mIU/L (ref 0.40–4.50)

## 2015-03-08 LAB — IFOBT (OCCULT BLOOD): IMMUNOLOGICAL FECAL OCCULT BLOOD TEST: NEGATIVE

## 2015-03-08 NOTE — Patient Instructions (Addendum)
1.  STOP INDERAL/PROPANOLOL NOW.   Because you received an x-ray today, you will receive an invoice from [facility]. Please contact [facility] at [phone] with questions or concerns regarding your invoice. Our billing staff will not be able to assist you with those questions.

## 2015-03-08 NOTE — Progress Notes (Signed)
Subjective:    Patient ID: Kyle Allen, male    DOB: 05/08/1970, 45 y.o.   MRN: [id number]  03/08/2015  Hypertension   HPI This 45 y.o. male presents for   1.  Lip lesion:  S/p lip biopsy; by Tollie Ethena Anderson; stopped topical creams prescribed by original dermatologist; bx immediately last week.  2.  HTN: Patient reports good compliance with medication, good tolerance to medication, and good symptom control.    3. Anxiety and depression:  Patient reports good compliance with medication, good tolerance to medication, and good symptom control.  Psychiatrist who increased Lexapro 20mg  daily. First visit end of January; goes back in a few weeks.  Meeting with a therapist before then; Dr. Elige KoWillets. Was in panic mode.   Going back to same place as with first psychiatrist.    4. Alcoholism: Has an breathalizer in car; cannot start car without blowing into breathalizer.  Must breathe into breathalizer regularly.  Has never failed the breathalizer.  Did not tolerate Naltraxone.    5.  Hypersomnolence: so tired; feels the fatigue coming; goes to bed at 9:30pm; wakes up 6:30am.  Fights sleep all day long.  Has fallen asleep twice at work.  No snoring.  Will fall asleep in a group full of people.  Going on for a while.  6.  Weight loss: not hungry; losing weight.  Not eating much.  Eats nabs at lunch; eats ice cream at bedtime.   Review of Systems  Constitutional: Negative for fever, chills, diaphoresis, activity change, appetite change and fatigue.  Respiratory: Negative for cough and shortness of breath.   Cardiovascular: Negative for chest pain, palpitations and leg swelling.  Gastrointestinal: Negative for nausea, vomiting, abdominal pain and diarrhea.  Endocrine: Negative for cold intolerance, heat intolerance, polydipsia, polyphagia and polyuria.  Skin: Negative for color change, rash and wound.  Neurological: Negative for dizziness, tremors, seizures, syncope, facial asymmetry, speech  difficulty, weakness, light-headedness, numbness and headaches.  Psychiatric/Behavioral: Negative for sleep disturbance and dysphoric mood. The patient is not nervous/anxious.     Past Medical History  Diagnosis Date  . Allergy   . Anxiety   . Depression   . Substance abuse   . Hypertension    No past surgical history on file. No Known Allergies  Social History   Social History  . Marital Status: Single    Spouse Name: N/A  . Number of Children: N/A  . Years of Education: N/A   Occupational History  . Not on file.   Social History Main Topics  . Smoking status: Current Every Day Smoker -- 1.00 packs/day for 20 years    Types: Cigarettes  . Smokeless tobacco: Never Used  . Alcohol Use: No  . Drug Use: No  . Sexual Activity: Not Currently   Other Topics Concern  . Not on file   Social History Narrative   Marital status: single; not dating.  From [place].       Children:  None       Lives: at [facility] since 07/2013.  Group home of 8 men in recovery.      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      Tobacco:  1 ppd x 20 years.  Never quit.      Alcohol: in recovery; drinking 1/5 per day; duration 20 years; DWIs x 5; last DWI 09/2009.  Gets license back this month; four years without license.  Drugs:  Marijuana, prescription pills (pain medications Oxycontin, Vicodin, some benzos).  No heroine or cocaine.      Exercise:  Six months; joined [employer]; cardio to start; free weights   Family History  Problem Relation Age of Onset  . Hyperlipidemia Mother   . Hypertension Mother   . Hyperlipidemia Father   . Hypertension Father   . Hyperlipidemia Brother   . Hypertension Brother   . Cancer Maternal Grandmother   . Hyperlipidemia Maternal Grandmother   . Hypertension Maternal Grandmother   . Hyperlipidemia Maternal Grandfather   . Hypertension Maternal Grandfather   . Diabetes Maternal  Grandfather   . Hyperlipidemia Paternal Grandmother   . Hypertension Paternal Grandmother   . Hyperlipidemia Paternal Grandfather   . Hypertension Paternal Grandfather   . Heart disease Paternal Grandfather        Objective:    BP 114/62 mmHg  Pulse 71  Temp(Src) 98.9 F (37.2 C)  Resp 16  Ht 6' (1.829 m)  Wt 166 lb (75.297 kg)  BMI 22.51 kg/m2 Physical Exam  Constitutional: He is oriented to person, place, and time. He appears well-developed and well-nourished. No distress.  HENT:  Head: Normocephalic and atraumatic.  Right Ear: External ear normal.  Left Ear: External ear normal.  Nose: Nose normal.  Mouth/Throat: Oropharynx is clear and moist.  Eyes: Conjunctivae and EOM are normal. Pupils are equal, round, and reactive to light.  Neck: Normal range of motion. Neck supple. Carotid bruit is not present. No thyromegaly present.  Cardiovascular: Normal rate, regular rhythm, normal heart sounds and intact distal pulses.  Exam reveals no gallop and no friction rub.   No murmur heard. Pulmonary/Chest: Effort normal and breath sounds normal. He has no wheezes. He has no rales.  Abdominal: Soft. Bowel sounds are normal. He exhibits no distension and no mass. There is no tenderness. There is no rebound and no guarding.  Lymphadenopathy:    He has no cervical adenopathy.  Neurological: He is alert and oriented to person, place, and time. No cranial nerve deficit.  Skin: Skin is warm and dry. No rash noted. He is not diaphoretic.  Psychiatric: He has a normal mood and affect. His behavior is normal.  Nursing note and vitals reviewed.   Filed Weights   03/08/15 1602  Weight: 166 lb (75.297 kg)      Assessment & Plan:   1. Anxiety and depression   2. Insomnia   3. Substance abuse in remission   4. Tobacco abuse   5. Herpes labialis   6. Essential hypertension   7. Blood pressure elevated   8. Weight loss, unintentional   9. Lip lesion     Orders Placed This Encounter    Procedures  . DG Chest 2 View    Standing Status: Future     Number of Occurrences: 1     Standing Expiration Date: 03/07/2016    Order Specific Question:  Reason for Exam (SYMPTOM  OR DIAGNOSIS REQUIRED)    Answer:  unintentional weight loss, smoker    Order Specific Question:  Preferred imaging location?    Answer:  External  . CBC with Differential/Platelet  . Comprehensive metabolic panel  . TSH  . HIV antibody  . T4, free  . PSA  . RPR  . Hemoglobin A1c  . POCT urinalysis dipstick  . POCT Microscopic Urinalysis ([facility])  . IFOBT POC (occult bld, rslt in office)   No orders of the defined types were placed in  this encounter.    Return in about 6 weeks (around 04/19/2015) for recheck blood pressure, weight.    Shadae Reino Paulita Fujita, M.D. [facility] [address] [place], [place]  [address] [phone] phone [phone] fax

## 2015-03-09 LAB — COMPREHENSIVE METABOLIC PANEL
ALBUMIN: 4.1 g/dL (ref 3.6–5.1)
ALT: 9 U/L (ref 9–46)
AST: 15 U/L (ref 10–40)
Alkaline Phosphatase: 24 U/L — ABNORMAL LOW (ref 40–115)
BILIRUBIN TOTAL: 0.1 mg/dL — AB (ref 0.2–1.2)
BUN: 31 mg/dL — AB (ref 7–25)
CHLORIDE: 107 mmol/L (ref 98–110)
CO2: 22 mmol/L (ref 20–31)
CREATININE: 1.26 mg/dL (ref 0.60–1.35)
Calcium: 9.2 mg/dL (ref 8.6–10.3)
Glucose, Bld: 73 mg/dL (ref 65–99)
Potassium: 4.4 mmol/L (ref 3.5–5.3)
SODIUM: 140 mmol/L (ref 135–146)
Total Protein: 6.3 g/dL (ref 6.1–8.1)

## 2015-03-09 LAB — PSA: PSA: 0.26 ng/mL (ref <=4.00)

## 2015-03-09 LAB — HEMOGLOBIN A1C
Hgb A1c MFr Bld: 5.4 % (ref <5.7)
MEAN PLASMA GLUCOSE: 108 mg/dL (ref <117)

## 2015-03-09 LAB — HIV ANTIBODY (ROUTINE TESTING W REFLEX): HIV: NONREACTIVE

## 2015-03-09 LAB — RPR

## 2015-03-17 ENCOUNTER — Encounter: Payer: Self-pay | Admitting: Family Medicine

## 2015-03-24 ENCOUNTER — Encounter: Payer: Self-pay | Admitting: Family Medicine

## 2015-03-24 ENCOUNTER — Ambulatory Visit (INDEPENDENT_AMBULATORY_CARE_PROVIDER_SITE_OTHER): Payer: BLUE CROSS/BLUE SHIELD | Admitting: Family Medicine

## 2015-03-24 VITALS — BP 126/80 | HR 82 | Temp 97.9°F | Ht 71.5 in | Wt 161.0 lb

## 2015-03-24 DIAGNOSIS — R946 Abnormal results of thyroid function studies: Secondary | ICD-10-CM

## 2015-03-24 DIAGNOSIS — K219 Gastro-esophageal reflux disease without esophagitis: Secondary | ICD-10-CM | POA: Diagnosis not present

## 2015-03-24 DIAGNOSIS — D6489 Other specified anemias: Secondary | ICD-10-CM | POA: Diagnosis not present

## 2015-03-24 DIAGNOSIS — R634 Abnormal weight loss: Secondary | ICD-10-CM

## 2015-03-24 DIAGNOSIS — K625 Hemorrhage of anus and rectum: Secondary | ICD-10-CM

## 2015-03-24 DIAGNOSIS — K130 Diseases of lips: Secondary | ICD-10-CM

## 2015-03-24 DIAGNOSIS — R140 Abdominal distension (gaseous): Secondary | ICD-10-CM | POA: Diagnosis not present

## 2015-03-24 LAB — POCT CBC
Granulocyte percent: 73.3 %G (ref 37–80)
HCT, POC: 29 % — AB (ref 43.5–53.7)
Hemoglobin: 9.9 g/dL — AB (ref 14.1–18.1)
LYMPH, POC: 1.5 (ref 0.6–3.4)
MCH: 28.5 pg (ref 27–31.2)
MCHC: 34 g/dL (ref 31.8–35.4)
MCV: 83.8 fL (ref 80–97)
MID (CBC): 0.1 (ref 0–0.9)
MPV: 5.9 fL (ref 0–99.8)
POC Granulocyte: 4.4 (ref 2–6.9)
POC LYMPH %: 24.4 % (ref 10–50)
POC MID %: 2.3 %M (ref 0–12)
Platelet Count, POC: 379 10*3/uL (ref 142–424)
RBC: 3.46 M/uL — AB (ref 4.69–6.13)
RDW, POC: 16.2 %
WBC: 6 10*3/uL (ref 4.6–10.2)

## 2015-03-24 LAB — FERRITIN: FERRITIN: 4 ng/mL — AB (ref 20–380)

## 2015-03-24 LAB — IBC PANEL
%SAT: 3 % — AB (ref 15–60)
TIBC: 538 ug/dL — AB (ref 250–425)
UIBC: 522 ug/dL — ABNORMAL HIGH (ref 125–400)

## 2015-03-24 LAB — T4, FREE: Free T4: 0.7 ng/dL — ABNORMAL LOW (ref 0.8–1.8)

## 2015-03-24 LAB — IRON: Iron: 16 ug/dL — ABNORMAL LOW (ref 50–180)

## 2015-03-24 LAB — HOMOCYSTEINE: HOMOCYSTEINE: 9.5 umol/L (ref <11.4)

## 2015-03-24 MED ORDER — OMEPRAZOLE 20 MG PO CPDR: 20.0000 mg | DELAYED_RELEASE_CAPSULE | Freq: Two times a day (BID) | ORAL | Status: DC

## 2015-03-24 NOTE — Progress Notes (Signed)
Subjective:    Patient ID: Kyle Allen, male    DOB: 1970-10-14, 45 y.o.   MRN: [id number]  03/24/2015  Follow-up and Anemia   HPI This 45 y.o. male presents for evaluation of anemia.  Repeating labs today.  Has been suffering with bloating sensation; lots of gas; bought Gas X in pat week.  Early satiety; decreased appetite. Nighttime bloating is the worse. Weight down 5 more pounds.  No melena; no recent bloody stools; most recent bloody stools within month; mostly with wiping.  Some dizziness; still very tired.  Less fatigued.  Fatigue has improved with stopping Propanalol.  Still falling asleep if sitting still.  No family history of colon cancer.  Does suffer with burning hunger pains every morning upon awakening.  Eating does not cause pain.  Lower lip biopsy negative for malignancy; s/p labs for lupus testing last week.  Thrilled. Vaseline and steroid topical cream.    Moving this weekend and th is week.  Staying tonight at house.  2 bedroom house.  No roommates.      Review of Systems  Constitutional: Negative for fever, chills, diaphoresis, activity change, appetite change and fatigue.  Respiratory: Negative for cough and shortness of breath.   Cardiovascular: Negative for chest pain, palpitations and leg swelling.  Gastrointestinal: Positive for abdominal distention. Negative for nausea, vomiting, abdominal pain, diarrhea, constipation, blood in stool, anal bleeding and rectal pain.  Endocrine: Negative for cold intolerance, heat intolerance, polydipsia, polyphagia and polyuria.  Skin: Negative for color change, rash and wound.  Neurological: Negative for dizziness, tremors, seizures, syncope, facial asymmetry, speech difficulty, weakness, light-headedness, numbness and headaches.  Hematological: Negative for adenopathy. Does not bruise/bleed easily.  Psychiatric/Behavioral: Negative for sleep disturbance and dysphoric mood. The patient is not nervous/anxious.     Past Medical  History  Diagnosis Date  . Allergy   . Anxiety   . Depression   . Substance abuse   . Hypertension    No past surgical history on file. No Known Allergies Current Outpatient Prescriptions  Medication Sig Dispense Refill  . clobetasol ointment (TEMOVATE) 0.05 % APP TO LIP TID  0  . escitalopram (LEXAPRO) 10 MG tablet TAKE 1 TO 2 TABLETS(10 TO 20 MG) BY MOUTH DAILY (Patient taking differently: TAKE 1-3  TABLETS(10 TO 30 MG) BY MOUTH DAILY) 180 tablet 0  . escitalopram (LEXAPRO) 20 MG tablet Take 1 tablet (20 mg total) by mouth at bedtime. (Patient taking differently: Take 20 mg by mouth at bedtime. Pt takes 1 - 25m tablet and 1 - 20 mg tablet to make 30 mg daily at bedtime Or 3 - 10 mg for 30 mg daily at bedtime) 30 tablet 5  . fluticasone (FLONASE) 50 MCG/ACT nasal spray Place 2 sprays into both nostrils daily. 16 g 11  . omeprazole (PRILOSEC) 20 MG capsule Take 1 capsule (20 mg total) by mouth 2 (two) times daily before a meal. 60 capsule 5   No current facility-administered medications for this visit.   Social History   Social History  . Marital Status: Single    Spouse Name: N/A  . Number of Children: N/A  . Years of Education: N/A   Occupational History  . Not on file.   Social History Main Topics  . Smoking status: Current Every Day Smoker -- 1.00 packs/day for 20 years    Types: Cigarettes  . Smokeless tobacco: Never Used  . Alcohol Use: No  . Drug Use: No  . Sexual Activity: Not  Currently   Other Topics Concern  . Not on file   Social History Narrative   Marital status: single; not dating.  From [place], [place].       Children:  None       Lives: at [facility] since 07/2013.  Group home of 8 men in recovery.      Employment:  Unemployed; applying for job maintenance at [employer].      Tobacco:  1 ppd x 20 years.  Never quit.      Alcohol: in recovery; drinking 1/5 per day; duration 20 years; DWIs x 5; last DWI 09/2009.  Gets  license back this month; four years without license.      Drugs:  Marijuana, prescription pills (pain medications Oxycontin, Vicodin, some benzos).  No heroine or cocaine.      Exercise:  Six months; joined [employer]; cardio to start; free weights   Family History  Problem Relation Age of Onset  . Hyperlipidemia Mother   . Hypertension Mother   . Hyperlipidemia Father   . Hypertension Father   . Hyperlipidemia Brother   . Hypertension Brother   . Cancer Maternal Grandmother   . Hyperlipidemia Maternal Grandmother   . Hypertension Maternal Grandmother   . Hyperlipidemia Maternal Grandfather   . Hypertension Maternal Grandfather   . Diabetes Maternal Grandfather   . Hyperlipidemia Paternal Grandmother   . Hypertension Paternal Grandmother   . Hyperlipidemia Paternal Grandfather   . Hypertension Paternal Grandfather   . Heart disease Paternal Grandfather        Objective:    BP 126/80 mmHg  Pulse 82  Temp(Src) 97.9 F (36.6 C) (Oral)  Ht 5' 11.5" (1.816 m)  Wt 161 lb (73.029 kg)  BMI 22.14 kg/m2  SpO2 98% Physical Exam  Constitutional: He is oriented to person, place, and time. He appears well-developed and well-nourished. No distress.  HENT:  Head: Normocephalic and atraumatic.  Right Ear: External ear normal.  Left Ear: External ear normal.  Nose: Nose normal.  Mouth/Throat: Oropharynx is clear and moist.  Eyes: Conjunctivae and EOM are normal. Pupils are equal, round, and reactive to light.  Neck: Normal range of motion. Neck supple. Carotid bruit is not present. No thyromegaly present.  Cardiovascular: Normal rate, regular rhythm, normal heart sounds and intact distal pulses.  Exam reveals no gallop and no friction rub.   No murmur heard. Pulmonary/Chest: Effort normal and breath sounds normal. He has no wheezes. He has no rales.  Abdominal: Soft. Bowel sounds are normal. He exhibits no distension and no mass. There is no tenderness. There is no rebound and no  guarding.  Lymphadenopathy:    He has no cervical adenopathy.  Neurological: He is alert and oriented to person, place, and time. No cranial nerve deficit.  Skin: Skin is warm and dry. No rash noted. He is not diaphoretic.  Psychiatric: He has a normal mood and affect. His behavior is normal.  Nursing note and vitals reviewed.  Results for orders placed or performed in visit on 03/24/15  POCT CBC  Result Value Ref Range   WBC 6.0 4.6 - 10.2 K/uL   Lymph, poc 1.5 0.6 - 3.4   POC LYMPH PERCENT 24.4 10 - 50 %L   MID (cbc) 0.1 0 - 0.9   POC MID % 2.3 0 - 12 %M   POC Granulocyte 4.4 2 - 6.9   Granulocyte percent 73.3 37 - 80 %G   RBC 3.46 (A) 4.69 -  6.13 M/uL   Hemoglobin 9.9 (A) 14.1 - 18.1 g/dL   HCT, POC 29.0 (A) 43.5 - 53.7 %   MCV 83.8 80 - 97 fL   MCH, POC 28.5 27 - 31.2 pg   MCHC 34.0 31.8 - 35.4 g/dL   RDW, POC 16.2 %   Platelet Count, POC 379 142 - 424 K/uL   MPV 5.9 0 - 99.8 fL    Filed Weights   03/24/15 0847  Weight: 161 lb (73.029 kg)       Assessment & Plan:   1. Anemia due to other cause   2. Thyroid function test abnormal   3. Gastroesophageal reflux disease without esophagitis   4. Bloating   5. Bright red blood per rectum   6. Lip lesion    -New. -H/H stable and actually improved from two weeks ago.  Hemosure negative two weeks ago. -send home with hemmocult kit for completion; then start ferrous sulfate bid with stool softener. -refer to GI for EGD and colonoscopy. -RTC immediately for dizziness/SOB. -repeat thyroid function. -lip lesion healing. -increase Omeprazole to 6m bid due to concern of active PUD. -ongoing weight loss; must rule out malignancy.  Orders Placed This Encounter  Procedures  . Iron  . IBC panel  . Ferritin  . Homocysteine  . T4, free  . Ambulatory referral to Gastroenterology    Referral Priority:  Routine    Referral Type:  Consultation    Referral Reason:  Specialty Services Required    Number of Visits Requested:   1  . POCT CBC  . POC Hemoccult Bld/Stl (3-Cd Home Screen)    Standing Status: Future     Number of Occurrences:      Standing Expiration Date: 03/23/2016   Meds ordered this encounter  Medications  . omeprazole (PRILOSEC) 20 MG capsule    Sig: Take 1 capsule (20 mg total) by mouth 2 (two) times daily before a meal.    Dispense:  60 capsule    Refill:  5    No Follow-up on file.    Seth Friedlander MElayne Guerin M.D. [facility] [address] [place]  [phone]

## 2015-03-24 NOTE — Patient Instructions (Addendum)
          IF you received an x-ray today, you will receive an invoice from [facility]. Please contact [facility] at [phone] with questions or concerns regarding your invoice.   IF you received labwork today, you will receive an invoice from [facility]. Please contact [facility] at [phone] with questions or concerns regarding your invoice.   Our billing staff will not be able to assist you with questions regarding bills from these companies.  You will be contacted with the lab results as soon as they are available. The fastest way to get your results is to activate your My Chart account. Instructions are located on the last page of this paperwork. If you have not heard from us regarding the results in 2 weeks, please contact this office.     FERROUS SULFATE 325MG  ONE TABLET TWICE DAILY FOR ANEMIA (OR SLOW-FE)

## 2015-03-25 NOTE — Telephone Encounter (Signed)
[facility] per [facility].  Possibly a Dr. Laural BenesJohnson?

## 2015-03-26 LAB — POC HEMOCCULT BLD/STL (HOME/3-CARD/SCREEN)
Card #2 Fecal Occult Blod, POC: NEGATIVE
Card #3 Fecal Occult Blood, POC: NEGATIVE
Fecal Occult Blood, POC: NEGATIVE

## 2015-03-26 NOTE — Addendum Note (Signed)
Addended by: Bronson CurbPOE, Akaila Rambo C on: 03/26/2015 12:22 PM   Modules accepted: Orders

## 2015-03-30 ENCOUNTER — Encounter: Payer: Self-pay | Admitting: Family Medicine

## 2015-04-08 ENCOUNTER — Other Ambulatory Visit: Payer: Self-pay | Admitting: Family Medicine

## 2015-04-08 MED ORDER — SUCRALFATE 1 GM/10ML PO SUSP: 1.0000 g | Freq: Two times a day (BID) | ORAL | Status: DC

## 2015-04-11 ENCOUNTER — Other Ambulatory Visit: Payer: Self-pay | Admitting: Family Medicine

## 2015-04-18 DIAGNOSIS — L439 Lichen planus, unspecified: Secondary | ICD-10-CM | POA: Diagnosis not present

## 2015-04-19 ENCOUNTER — Ambulatory Visit (INDEPENDENT_AMBULATORY_CARE_PROVIDER_SITE_OTHER): Payer: BLUE CROSS/BLUE SHIELD | Admitting: Family Medicine

## 2015-04-19 ENCOUNTER — Encounter: Payer: Self-pay | Admitting: Family Medicine

## 2015-04-19 VITALS — BP 136/70 | HR 69 | Temp 99.4°F | Resp 16 | Ht 72.0 in | Wt 163.4 lb

## 2015-04-19 DIAGNOSIS — R5383 Other fatigue: Secondary | ICD-10-CM | POA: Diagnosis not present

## 2015-04-19 DIAGNOSIS — R61 Generalized hyperhidrosis: Secondary | ICD-10-CM

## 2015-04-19 DIAGNOSIS — F32A Depression, unspecified: Secondary | ICD-10-CM

## 2015-04-19 DIAGNOSIS — K219 Gastro-esophageal reflux disease without esophagitis: Secondary | ICD-10-CM

## 2015-04-19 DIAGNOSIS — F418 Other specified anxiety disorders: Secondary | ICD-10-CM

## 2015-04-19 DIAGNOSIS — F419 Anxiety disorder, unspecified: Secondary | ICD-10-CM

## 2015-04-19 DIAGNOSIS — D509 Iron deficiency anemia, unspecified: Secondary | ICD-10-CM

## 2015-04-19 DIAGNOSIS — R946 Abnormal results of thyroid function studies: Secondary | ICD-10-CM

## 2015-04-19 DIAGNOSIS — F329 Major depressive disorder, single episode, unspecified: Secondary | ICD-10-CM

## 2015-04-19 LAB — POCT CBC
Granulocyte percent: 66.3 %G (ref 37–80)
HEMATOCRIT: 32.6 % — AB (ref 43.5–53.7)
Hemoglobin: 10.6 g/dL — AB (ref 14.1–18.1)
Lymph, poc: 1.6 (ref 0.6–3.4)
MCH, POC: 29.1 pg (ref 27–31.2)
MCHC: 32.6 g/dL (ref 31.8–35.4)
MCV: 89.3 fL (ref 80–97)
MID (CBC): 0.3 (ref 0–0.9)
MPV: 5.9 fL (ref 0–99.8)
POC Granulocyte: 3.7 (ref 2–6.9)
POC LYMPH %: 27.8 % (ref 10–50)
POC MID %: 5.9 %M (ref 0–12)
Platelet Count, POC: 266 10*3/uL (ref 142–424)
RBC: 3.65 M/uL — AB (ref 4.69–6.13)
RDW, POC: 22.8 %
WBC: 5.6 10*3/uL (ref 4.6–10.2)

## 2015-04-19 NOTE — Progress Notes (Signed)
Subjective:    Patient ID: Kyle Allen, male    DOB: 04/14/1970, 45 y.o.   MRN: [id number]  04/19/2015  Follow-up and Mouth Lesions   HPI This 45 y.o. male presents for one month follow-up for iron deficiency anemia.  Taking iron bid.    In mornings, takes two antidepressants two 20mg  daily.  Taking iron tablet, MVI, B12, Omeprazole 20mg .  Every time eats, stomach bloats and distends; feels like needs to pass gas; bought Gas-X.  Has appointment with GI next week.  Coming up.  Carafate causes horrible nausea; took Carafate and vomited all night.  At nighttime, having reflux/regurgitating. Throat getting tight and vomits.  Happens when sweating it out.  Trying not to stress out.  Not much thinking involved in maintenance.    Lip lesion: applying sun screen daily; restarted skin lesion; lesion was gone.  Then all of the sudden, felt skin lesion recurring.  Had an appointment; increased medication.  Follow-up in two months.    Night sweats: onset one month ago; likely two months; not every night; now every night.  So wet, it is gross.  Sheets still wet from night before.  Needs new pillows.  No nightmares.    Anxiety: psychiatry increased Lexapro to 40mg  daily.  Psychiatry tomorrow morning.  House is great; [place] income [place] neighborhood.  No problems.  Feels safe.  Couple of friends doing sketchy stuff; riding around at night.  Temptations.  Two years sober 04/03/15.  Rent is expensive.    Review of Systems  Constitutional: Positive for diaphoresis and fatigue. Negative for fever, chills, activity change and appetite change.  Respiratory: Negative for cough and shortness of breath.   Cardiovascular: Negative for chest pain, palpitations and leg swelling.  Gastrointestinal: Positive for vomiting and abdominal distention. Negative for nausea, abdominal pain, diarrhea, constipation, blood in stool, anal bleeding and rectal pain.  Endocrine: Negative for cold intolerance, heat intolerance,  polydipsia, polyphagia and polyuria.  Skin: Negative for color change, rash and wound.  Neurological: Negative for dizziness, tremors, seizures, syncope, facial asymmetry, speech difficulty, weakness, light-headedness, numbness and headaches.  Psychiatric/Behavioral: Positive for dysphoric mood. Negative for suicidal ideas, sleep disturbance and self-injury. The patient is nervous/anxious.     Past Medical History  Diagnosis Date  . Allergy   . Anxiety   . Depression   . Substance abuse   . Hypertension     not on meds presently by MD action.  Marland Kitchen. History of kidney stones     episodes x1 -multiple stones- not a bother at this time.  Marland Kitchen. GERD (gastroesophageal reflux disease)   . Anemia    No past surgical history on file. No Known Allergies Current Outpatient Prescriptions  Medication Sig Dispense Refill  . clobetasol ointment (TEMOVATE) 0.05 % APP TO LIP TID  0  . escitalopram (LEXAPRO) 10 MG tablet TAKE 1 TO 2 TABLETS(10 TO 20 MG) BY MOUTH DAILY (Patient taking differently: TAKE 1-3  TABLETS(10 TO 30 MG) BY MOUTH DAILY) 180 tablet 0  . escitalopram (LEXAPRO) 20 MG tablet TAKE 1 TABLET(20 MG) BY MOUTH AT BEDTIME 90 tablet 1  . omeprazole (PRILOSEC) 20 MG capsule Take 1 capsule (20 mg total) by mouth 2 (two) times daily before a meal. 60 capsule 5  . fluticasone (FLONASE) 50 MCG/ACT nasal spray SHAKE WELL AND USE 2 SPRAYS IN EACH NOSTRIL DAILY 48 g 3  . sucralfate (CARAFATE) 1 GM/10ML suspension Take 10 mLs (1 g total) by mouth 2 (two) times  daily. Take before lunch and at bedtime. (Patient not taking: Reported on 04/19/2015) 420 mL 0   No current facility-administered medications for this visit.   Social History   Social History  . Marital Status: Single    Spouse Name: N/A  . Number of Children: N/A  . Years of Education: N/A   Occupational History  . Not on file.   Social History Main Topics  . Smoking status: Current Every Day Smoker -- 1.00 packs/day for 20 years     Types: Cigarettes  . Smokeless tobacco: Never Used     Comment: Opiate use   . Alcohol Use: No     Comment: Quit  2015 also.  . Drug Use: Yes    Special: Marijuana, Other-see comments, Heroin     Comment: Quit 04-02-13 Attends AA  . Sexual Activity: Not Currently   Other Topics Concern  . Not on file   Social History Narrative   Marital status: single; not dating.  From [place], [place].       Children:  None       Lives: at [facility] since 07/2013.  Group home of 8 men in recovery.      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      Tobacco:  1 ppd x 20 years.  Never quit.      Alcohol: in recovery; drinking 1/5 per day; duration 20 years; DWIs x 5; last DWI 09/2009.  Gets license back this month; four years without license.      Drugs:  Marijuana, prescription pills (pain medications Oxycontin, Vicodin, some benzos).  No heroine or cocaine.      Exercise:  Six months; joined [employer]; cardio to start; free weights   Family History  Problem Relation Age of Onset  . Hyperlipidemia Mother   . Hypertension Mother   . Hyperlipidemia Father   . Hypertension Father   . Hyperlipidemia Brother   . Hypertension Brother   . Cancer Maternal Grandmother   . Hyperlipidemia Maternal Grandmother   . Hypertension Maternal Grandmother   . Hyperlipidemia Maternal Grandfather   . Hypertension Maternal Grandfather   . Diabetes Maternal Grandfather   . Hyperlipidemia Paternal Grandmother   . Hypertension Paternal Grandmother   . Hyperlipidemia Paternal Grandfather   . Hypertension Paternal Grandfather   . Heart disease Paternal Grandfather        Objective:    BP 136/70 mmHg  Pulse 69  Temp(Src) 99.4 F (37.4 C) (Oral)  Resp 16  Ht 6' (1.829 m)  Wt 163 lb 6.4 oz (74.118 kg)  BMI 22.16 kg/m2  SpO2 96% Physical Exam  Constitutional: He is oriented to person, place, and time. He appears well-developed and well-nourished. No  distress.  HENT:  Head: Normocephalic and atraumatic.  Right Ear: External ear normal.  Left Ear: External ear normal.  Nose: Nose normal.  Mouth/Throat: Oropharynx is clear and moist.  Eyes: Conjunctivae and EOM are normal. Pupils are equal, round, and reactive to light.  Neck: Normal range of motion. Neck supple. Carotid bruit is not present. No thyromegaly present.  Cardiovascular: Normal rate, regular rhythm, normal heart sounds and intact distal pulses.  Exam reveals no gallop and no friction rub.   No murmur heard. Pulmonary/Chest: Effort normal and breath sounds normal. He has no wheezes. He has no rales.  Abdominal: Soft. Bowel sounds are normal. He exhibits no distension and no mass. There is no tenderness. There is no  rebound and no guarding.  Lymphadenopathy:    He has no cervical adenopathy.  Neurological: He is alert and oriented to person, place, and time. No cranial nerve deficit.  Skin: Skin is warm and dry. No rash noted. He is not diaphoretic.  Psychiatric: He has a normal mood and affect. His behavior is normal.  Nursing note and vitals reviewed.  Results for orders placed or performed in visit on 04/19/15  Comprehensive metabolic panel  Result Value Ref Range   Sodium 141 135 - 146 mmol/L   Potassium 4.3 3.5 - 5.3 mmol/L   Chloride 112 (H) 98 - 110 mmol/L   CO2 17 (L) 20 - 31 mmol/L   Glucose, Bld 84 65 - 99 mg/dL   BUN 22 7 - 25 mg/dL   Creat 1.30 8.65 - 7.84 mg/dL   Total Bilirubin 0.1 (L) 0.2 - 1.2 mg/dL   Alkaline Phosphatase 22 (L) 40 - 115 U/L   AST 15 10 - 40 U/L   ALT 12 9 - 46 U/L   Total Protein 6.1 6.1 - 8.1 g/dL   Albumin 4.2 3.6 - 5.1 g/dL   Calcium 8.4 (L) 8.6 - 10.3 mg/dL  T4, free  Result Value Ref Range   Free T4 0.5 (L) 0.8 - 1.8 ng/dL  POCT CBC  Result Value Ref Range   WBC 5.6 4.6 - 10.2 K/uL   Lymph, poc 1.6 0.6 - 3.4   POC LYMPH PERCENT 27.8 10 - 50 %L   MID (cbc) 0.3 0 - 0.9   POC MID % 5.9 0 - 12 %M   POC Granulocyte 3.7 2 -  6.9   Granulocyte percent 66.3 37 - 80 %G   RBC 3.65 (A) 4.69 - 6.13 M/uL   Hemoglobin 10.6 (A) 14.1 - 18.1 g/dL   HCT, POC 69.6 (A) 29.5 - 53.7 %   MCV 89.3 80 - 97 fL   MCH, POC 29.1 27 - 31.2 pg   MCHC 32.6 31.8 - 35.4 g/dL   RDW, POC 28.4 %   Platelet Count, POC 266 142 - 424 K/uL   MPV 5.9 0 - 99.8 fL       Assessment & Plan:   1. Anemia, iron deficiency   2. Night sweats   3. Gastroesophageal reflux disease without esophagitis   4. Anxiety and depression   5. Other fatigue   6. Thyroid function study abnormality    -anemia improving. -continues to have ongoing GERD, bloating, early satiety.  Intolerant to Carafate.  Continue Omeprazole  bid.  Appointment with GI next week. -no further weight loss. -feel that persistent hypersomnolence and sweats due to Lexapro  daily; appointment tomorrow with psychiatry; recommend discussing decrease in dose of Lexapro to 30 or  daily. -blood pressure improved off of medication. -continue ferrous sulfate, multivitamin.  Orders Placed This Encounter  Procedures  . Comprehensive metabolic panel  . T4, free  . POCT CBC   No orders of the defined types were placed in this encounter.    No Follow-up on file.    Velencia Lenart Paulita Fujita, M.D. [facility] [address] [place], [place]  [address] [phone] phone [phone] fax

## 2015-04-19 NOTE — Patient Instructions (Signed)
1.  Take iron/ferrous sulfate, multivitamin, escitalopram every morning. 2. Take Omeprazole 20mg  one hour BEFORE lunch. 3.  Take second ferrous sulfate 325mg  in afternoon.  4.  Take second Omeprazole at 9:30pm. 5.  STOP B12 supplement.

## 2015-04-20 DIAGNOSIS — F331 Major depressive disorder, recurrent, moderate: Secondary | ICD-10-CM | POA: Diagnosis not present

## 2015-04-20 LAB — COMPREHENSIVE METABOLIC PANEL
ALBUMIN: 4.2 g/dL (ref 3.6–5.1)
ALT: 12 U/L (ref 9–46)
AST: 15 U/L (ref 10–40)
Alkaline Phosphatase: 22 U/L — ABNORMAL LOW (ref 40–115)
BILIRUBIN TOTAL: 0.1 mg/dL — AB (ref 0.2–1.2)
BUN: 22 mg/dL (ref 7–25)
CALCIUM: 8.4 mg/dL — AB (ref 8.6–10.3)
CO2: 17 mmol/L — AB (ref 20–31)
CREATININE: 1.04 mg/dL (ref 0.60–1.35)
Chloride: 112 mmol/L — ABNORMAL HIGH (ref 98–110)
Glucose, Bld: 84 mg/dL (ref 65–99)
Potassium: 4.3 mmol/L (ref 3.5–5.3)
SODIUM: 141 mmol/L (ref 135–146)
TOTAL PROTEIN: 6.1 g/dL (ref 6.1–8.1)

## 2015-04-20 LAB — T4, FREE: FREE T4: 0.5 ng/dL — AB (ref 0.8–1.8)

## 2015-04-21 ENCOUNTER — Other Ambulatory Visit: Payer: Self-pay | Admitting: Family Medicine

## 2015-04-22 NOTE — Telephone Encounter (Signed)
Dr Katrinka BlazingSmith, you have seen pt several times recently, but don't see AR discussed since a year ago. Can we give pt RFs?

## 2015-04-27 ENCOUNTER — Other Ambulatory Visit: Payer: Self-pay | Admitting: Gastroenterology

## 2015-04-27 DIAGNOSIS — D509 Iron deficiency anemia, unspecified: Secondary | ICD-10-CM | POA: Diagnosis not present

## 2015-04-29 ENCOUNTER — Encounter (HOSPITAL_COMMUNITY): Payer: Self-pay | Admitting: *Deleted

## 2015-05-03 ENCOUNTER — Encounter (HOSPITAL_COMMUNITY)
Admission: RE | Disposition: A | Discharge status: Home / Self Care | Payer: Self-pay | Source: Ambulatory Visit | Attending: Gastroenterology

## 2015-05-03 ENCOUNTER — Ambulatory Visit (HOSPITAL_COMMUNITY): Payer: BLUE CROSS/BLUE SHIELD | Admitting: Anesthesiology

## 2015-05-03 ENCOUNTER — Ambulatory Visit (HOSPITAL_COMMUNITY)
Admission: RE | Admit: 2015-05-03 | Discharge: 2015-05-03 | Disposition: A | Discharge status: Home / Self Care | Payer: BLUE CROSS/BLUE SHIELD | Source: Ambulatory Visit | Attending: Gastroenterology | Admitting: Gastroenterology

## 2015-05-03 ENCOUNTER — Encounter (HOSPITAL_COMMUNITY): Payer: Self-pay | Admitting: *Deleted

## 2015-05-03 DIAGNOSIS — F1021 Alcohol dependence, in remission: Secondary | ICD-10-CM | POA: Diagnosis not present

## 2015-05-03 DIAGNOSIS — K219 Gastro-esophageal reflux disease without esophagitis: Secondary | ICD-10-CM | POA: Diagnosis not present

## 2015-05-03 DIAGNOSIS — D509 Iron deficiency anemia, unspecified: Secondary | ICD-10-CM | POA: Diagnosis not present

## 2015-05-03 DIAGNOSIS — R1013 Epigastric pain: Secondary | ICD-10-CM | POA: Diagnosis not present

## 2015-05-03 DIAGNOSIS — K921 Melena: Secondary | ICD-10-CM | POA: Insufficient documentation

## 2015-05-03 DIAGNOSIS — I10 Essential (primary) hypertension: Secondary | ICD-10-CM | POA: Diagnosis not present

## 2015-05-03 DIAGNOSIS — R6881 Early satiety: Secondary | ICD-10-CM | POA: Diagnosis not present

## 2015-05-03 DIAGNOSIS — R1010 Upper abdominal pain, unspecified: Secondary | ICD-10-CM | POA: Diagnosis not present

## 2015-05-03 DIAGNOSIS — K253 Acute gastric ulcer without hemorrhage or perforation: Secondary | ICD-10-CM | POA: Insufficient documentation

## 2015-05-03 DIAGNOSIS — K259 Gastric ulcer, unspecified as acute or chronic, without hemorrhage or perforation: Secondary | ICD-10-CM | POA: Diagnosis not present

## 2015-05-03 DIAGNOSIS — R634 Abnormal weight loss: Secondary | ICD-10-CM | POA: Insufficient documentation

## 2015-05-03 DIAGNOSIS — F17210 Nicotine dependence, cigarettes, uncomplicated: Secondary | ICD-10-CM | POA: Insufficient documentation

## 2015-05-03 HISTORY — DX: Personal history of urinary calculi: Z87.442

## 2015-05-03 HISTORY — PX: ESOPHAGOGASTRODUODENOSCOPY (EGD) WITH PROPOFOL: SHX5813

## 2015-05-03 HISTORY — PX: COLONOSCOPY WITH PROPOFOL: SHX5780

## 2015-05-03 HISTORY — DX: Anemia, unspecified: D64.9

## 2015-05-03 HISTORY — DX: Gastro-esophageal reflux disease without esophagitis: K21.9

## 2015-05-03 SURGERY — COLONOSCOPY WITH PROPOFOL
Anesthesia: Monitor Anesthesia Care

## 2015-05-03 MED ORDER — LACTATED RINGERS IV SOLN
INTRAVENOUS | Status: DC
  Administered 2015-05-03: 1000 mL via INTRAVENOUS

## 2015-05-03 MED ORDER — PROPOFOL 500 MG/50ML IV EMUL
INTRAVENOUS | Status: DC | PRN
  Administered 2015-05-03 (×2): 30 mg via INTRAVENOUS

## 2015-05-03 MED ORDER — SODIUM CHLORIDE 0.9 % IV SOLN: INTRAVENOUS | Status: DC

## 2015-05-03 MED ORDER — PROPOFOL 500 MG/50ML IV EMUL
INTRAVENOUS | Status: DC | PRN
Start: 2015-05-03 — End: 2015-05-03
  Administered 2015-05-03: 125 ug/kg/min via INTRAVENOUS

## 2015-05-03 MED ORDER — PROPOFOL 10 MG/ML IV BOLUS
INTRAVENOUS | Status: AC
  Filled 2015-05-03: qty 40

## 2015-05-03 SURGICAL SUPPLY — 24 items

## 2015-05-03 NOTE — H&P (Signed)
  Problems: Unexplained iron deficiency anemia, upper abdominal pain, early satiety, unintentional weight loss. Hemoglobin 8.8 g. Stool Hemoccult negative. Serum iron saturation 3%. Serum ferritin 4 ng/mL. HIV screen negative.  History: The patient is a 45 year old male born 02/28/1970. He is a recovering alcoholic and substance abuser for over 30 years. He has remained abstinent for 2 years. He never used intravenous drugs. He would consume up to a fifth of vodka daily.  For approximately 6 months, the patient does experience early satiety associated with upper abdominal discomfort and a 25 pound weight loss due to sitophobia. He denies nausea or vomiting. Intermittently, he passes a small amount of blood with his otherwise normal bowel movements.  He denies a family history of colon cancer or inflammatory bowel disease.  He is scheduled to undergo diagnostic esophagogastroduodenoscopy and colonoscopy to evaluate unexplained iron deficiency anemia  Medication allergies: None  Past medical history: Anxiety with depression. Hypertension.  Exam: The patient is alert and lying comfortably on the endoscopy stretcher. Abdomen is soft and nontender to palpation. Lungs are clear to auscultation. Cardiac exam reveals a regular rhythm.  Plan: Proceed with diagnostic esophagogastroduodenoscopy and colonoscopy

## 2015-05-03 NOTE — Transfer of Care (Signed)
Immediate Anesthesia Transfer of Care Note  Patient: Kyle Allen  Procedure(s) Performed: Procedure(s): COLONOSCOPY WITH PROPOFOL (N/A) ESOPHAGOGASTRODUODENOSCOPY (EGD) WITH PROPOFOL (N/A)  Patient Location: PACU  Anesthesia Type:MAC  Level of Consciousness: sedated, patient cooperative and responds to stimulation  Airway & Oxygen Therapy: Patient Spontanous Breathing and Patient connected to nasal cannula oxygen  Post-op Assessment: Report given to RN and Post -op Vital signs reviewed and stable  Post vital signs: Reviewed and stable  Last Vitals:  Filed Vitals:   05/03/15 1309  BP: 122/65  Pulse: 52  Temp: 37.2 C  Resp: 12    Last Pain: There were no vitals filed for this visit.       Complications: No apparent anesthesia complications

## 2015-05-03 NOTE — Discharge Instructions (Signed)
Colonoscopy, Care After These instructions give you information on caring for yourself after your procedure. Your doctor may also give you more specific instructions. Call your doctor if you have any problems or questions after your procedure. HOME CARE  Do not drive for 24 hours.  Do not sign important papers or use machinery for 24 hours.  You may shower.  You may go back to your usual activities, but go slower for the first 24 hours.  Take rest breaks often during the first 24 hours.  Walk around or use warm packs on your belly (abdomen) if you have belly cramping or gas.  Drink enough fluids to keep your pee (urine) clear or pale yellow.  Resume your normal diet. Avoid heavy or fried foods.  Avoid drinking alcohol for 24 hours or as told by your doctor.  Only take medicines as told by your doctor. If a tissue sample (biopsy) was taken during the procedure:   Do not take aspirin or blood thinners for 7 days, or as told by your doctor.  Do not drink alcohol for 7 days, or as told by your doctor.  Eat soft foods for the first 24 hours. GET HELP IF: You still have a small amount of blood in your poop (stool) 2-3 days after the procedure. GET HELP RIGHT AWAY IF:  You have more than a small amount of blood in your poop.  You see clumps of tissue (blood clots) in your poop.  Your belly is puffy (swollen).  You feel sick to your stomach (nauseous) or throw up (vomit).  You have a fever.  You have belly pain that gets worse and medicine does not help. MAKE SURE YOU:  Understand these instructions.  Will watch your condition.  Will get help right away if you are not doing well or get worse.   This information is not intended to replace advice given to you by your health care provider. Make sure you discuss any questions you have with your health care provider.   Document Released: 01/20/2010 Document REsophagogastroduodenoscopy, Care After Refer to this sheet in the  next few weeks. These instructions provide you with information about caring for yourself after your procedure. Your health care provider may also give you more specific instructions. Your treatment has been planned according to current medical practices, but problems sometimes occur. Call your health care provider if you have any problems or questions after your procedure. WHAT TO EXPECT AFTER THE PROCEDURE After your procedure, it is typical to feel:  Soreness in your throat.  Pain with swallowing.  Sick to your stomach (nauseous).  Bloated.  Dizzy.  Fatigued. HOME CARE INSTRUCTIONS  Do not eat or drink anything until the numbing medicine (local anesthetic) has worn off and your gag reflex has returned. You will know that the local anesthetic has worn off when you can swallow comfortably.  Do not drive or operate machinery until directed by your health care provider.  Take medicines only as directed by your health care provider. SEEK MEDICAL CARE IF:   You cannot stop coughing.  You are not urinating at all or less than usual. SEEK IMMEDIATE MEDICAL CARE IF:  You have difficulty swallowing.  You cannot eat or drink.  You have worsening throat or chest pain.  You have dizziness or lightheadedness or you faint.  You have nausea or vomiting.  You have chills.  You have a fever.  You have severe abdominal pain.  You have black, tarry, or bloody  stools.   This information is not intended to replace advice given to you by your health care provider. Make sure you discuss any questions you have with your health care provider.   Document Released: 12/05/2011 Document Revised: 01/08/2014 Document Reviewed: 12/05/2011 Elsevier Interactive Patient Education 2016 [employer] evised: 12/23/2012 Document Reviewed: 08/25/2012 Elsevier Interactive Patient Education [employer]

## 2015-05-03 NOTE — Op Note (Signed)
[facility] Patient Name: Kyle Allen Procedure Date: 05/03/2015 MRN: [id number] Attending MD: Charolett BumpersMartin K Reeshemah Nazaryan , MD Date of Birth: 10/16/1970 CSN:  Age: [age] Admit Type: Outpatient Procedure:                Colonoscopy Indications:              Hematochezia, Unexplained iron deficiency anemia Providers:                Charolett BumpersMartin K. Binyomin Brann, MD, Omelia BlackwaterShelby Carpenter, RN, Harrington ChallengerHope                            Parker, Technician Referring MD:              Medicines:                Propofol per Anesthesia Complications:            No immediate complications. Estimated Blood Loss:     Estimated blood loss: none. Procedure:                Pre-Anesthesia Assessment:                           - Prior to the procedure, a History and Physical                            was performed, and patient medications and                            allergies were reviewed. The patient's tolerance of                            previous anesthesia was also reviewed. The risks                            and benefits of the procedure and the sedation                            options and risks were discussed with the patient.                            All questions were answered, and informed consent                            was obtained. Prior Anticoagulants: The patient has                            taken no previous anticoagulant or antiplatelet                            agents. ASA Grade Assessment: II - A patient with                            mild systemic disease. After reviewing the risks  and benefits, the patient was deemed in                            satisfactory condition to undergo the procedure.                           - Prior to the procedure, a History and Physical                            was performed, and patient medications and                            allergies were reviewed. The patient's tolerance of                            previous  anesthesia was also reviewed. The risks                            and benefits of the procedure and the sedation                            options and risks were discussed with the patient.                            All questions were answered, and informed consent                            was obtained. Prior Anticoagulants: The patient has                            taken no previous anticoagulant or antiplatelet                            agents. ASA Grade Assessment: II - A patient with                            mild systemic disease. After reviewing the risks                            and benefits, the patient was deemed in                            satisfactory condition to undergo the procedure.                           After obtaining informed consent, the colonoscope                            was passed under direct vision. Throughout the                            procedure, the patient's blood pressure, pulse, and  oxygen saturations were monitored continuously. The                            EC-[id number] ([id number]) scope was introduced through                            the anus and advanced to the the cecum, identified                            by appendiceal orifice and ileocecal valve. The                            colonoscopy was performed without difficulty. The                            patient tolerated the procedure well. The quality                            of the bowel preparation was good. The ileocecal                            valve, the appendiceal orifice and the rectum were                            photographed. Scope In: 2:24:30 PM Scope Out: 2:42:59 PM Scope Withdrawal Time: 0 hours 9 minutes 7 seconds  Total Procedure Duration: 0 hours 18 minutes 29 seconds  Findings:      The perianal and digital rectal examinations were normal.      The entire examined colon appeared normal. Impression:               - The entire  examined colon is normal.                           - No specimens collected. Moderate Sedation:      N/A- Per Anesthesia Care Recommendation:           - Patient has a contact number available for                            emergencies. The signs and symptoms of potential                            delayed complications were discussed with the                            patient. Return to normal activities tomorrow.                            Written discharge instructions were provided to the                            patient.                           - Repeat  colonoscopy in 10 years for screening                            purposes.                           - Resume previous diet.                           - Continue present medications. Procedure Code(s):        --- Professional ---                           [phone], Colonoscopy, flexible; diagnostic, including                            collection of specimen(s) by brushing or washing,                            when performed (separate procedure) Diagnosis Code(s):        --- Professional ---                           K92.1, Melena (includes Hematochezia)                           D50.9, Iron deficiency anemia, unspecified CPT copyright 2016 American Medical Association. All rights reserved. The codes documented in this report are preliminary and upon coder review may  be revised to meet current compliance requirements. Danise Edge, MD Charolett Bumpers, MD 05/03/2015 2:57:28 PM This report has been signed electronically. Number of Addenda: 0

## 2015-05-03 NOTE — Anesthesia Postprocedure Evaluation (Signed)
Anesthesia Post Note  Patient: Loyce Dysrik Kwasnik  Procedure(s) Performed: Procedure(s) (LRB): COLONOSCOPY WITH PROPOFOL (N/A) ESOPHAGOGASTRODUODENOSCOPY (EGD) WITH PROPOFOL (N/A)  Patient location during evaluation: Endoscopy Anesthesia Type: MAC Level of consciousness: awake and alert Pain management: pain level controlled Vital Signs Assessment: post-procedure vital signs reviewed and stable Respiratory status: spontaneous breathing, nonlabored ventilation, respiratory function stable and patient connected to nasal cannula oxygen Cardiovascular status: stable and blood pressure returned to baseline Anesthetic complications: no    Last Vitals:  Filed Vitals:   05/03/15 1309 05/03/15 1450  BP: 122/65 99/54  Pulse: 52 56  Temp: 37.2 C   Resp: 12 13    Last Pain: There were no vitals filed for this visit.               Shelton SilvasKevin D Hollis

## 2015-05-03 NOTE — Op Note (Signed)
[facility] Patient Name: Kyle Allen Procedure Date: 05/03/2015 MRN: [id number] Attending MD: Charolett Bumpers , MD Date of Birth: 1970-10-14 CSN:  Age: 45 Admit Type: Outpatient Procedure:                Upper GI endoscopy Indications:              Epigastric abdominal pain, Unexplained iron                            deficiency anemia, Early satiety, Weight loss Providers:                Charolett Bumpers, MD, Omelia Blackwater, RN, Harrington Challenger, Technician Referring MD:              Medicines:                Propofol per Anesthesia Complications:            No immediate complications. Estimated Blood Loss:     Estimated blood loss: none. Procedure:                Pre-Anesthesia Assessment:                           - Prior to the procedure, a History and Physical                            was performed, and patient medications and                            allergies were reviewed. The patient's tolerance of                            previous anesthesia was also reviewed. The risks                            and benefits of the procedure and the sedation                            options and risks were discussed with the patient.                            All questions were answered, and informed consent                            was obtained. Prior Anticoagulants: The patient has                            taken no previous anticoagulant or antiplatelet                            agents. ASA Grade Assessment: II - A patient with  mild systemic disease. After reviewing the risks                            and benefits, the patient was deemed in                            satisfactory condition to undergo the procedure.                           After obtaining informed consent, the endoscope was                            passed under direct vision. Throughout the                            procedure, the  patient's blood pressure, pulse, and                            oxygen saturations were monitored continuously. The                            [id number] ([id number] scope was introduced through the                            mouth, and advanced to the second part of duodenum.                            The upper GI endoscopy was accomplished without                            difficulty. The patient tolerated the procedure                            well. Scope In: Scope Out: Findings:      The Z-line was irregular and was found 40 cm from the incisors.      The examined esophagus was normal.      Two benign appearing non-bleeding cratered gastric ulcers with no       stigmata of bleeding were found in the prepyloric region of the stomach.       The largest lesion was 30 mm in largest dimension. The other ulcer was 5       mm in diameter. Biopsies were taken with a cold forceps for histology.       Otherwise the stomach appeared normal.      The examined duodenum was normal. Impression:               - Z-line irregular, 40 cm from the incisors.                           - Normal esophagus.                           - Non-bleeding gastric ulcers with no stigmata of  bleeding. Biopsied.                           - Normal examined duodenum. Moderate Sedation:      N/A- Per Anesthesia Care Recommendation:           - Patient has a contact number available for                            emergencies. The signs and symptoms of potential                            delayed complications were discussed with the                            patient. Return to normal activities tomorrow.                            Written discharge instructions were provided to the                            patient.                           - Await pathology results.                           - Resume previous diet.                           - Continue present medications. Procedure  Code(s):        --- Professional ---                           [phone], Esophagogastroduodenoscopy, flexible,                            transoral; with biopsy, single or multiple Diagnosis Code(s):        --- Professional ---                           K25.9, Gastric ulcer, unspecified as acute or                            chronic, without hemorrhage or perforation                           R10.13, Epigastric pain                           D50.9, Iron deficiency anemia, unspecified                           R68.81, Early satiety                           R63.4, Abnormal weight loss CPT copyright 2016 American Medical Association. All rights reserved. The codes documented in this report  are preliminary and upon coder review may  be revised to meet current compliance requirements. Danise Edge, MD Charolett Bumpers, MD 05/03/2015 2:54:23 PM This report has been signed electronically. Number of Addenda: 0

## 2015-05-03 NOTE — Anesthesia Preprocedure Evaluation (Addendum)
Anesthesia Evaluation  Patient identified by MRN, date of birth, ID band Patient awake    Reviewed: Allergy & Precautions, NPO status , Patient's Chart, lab work & pertinent test results  Airway Mallampati: I  TM Distance: >3 FB Neck ROM: Full    Dental  (+) Teeth Intact   Pulmonary Current Smoker,    breath sounds clear to auscultation       Cardiovascular hypertension,  Rhythm:Regular Rate:Normal     Neuro/Psych PSYCHIATRIC DISORDERS Anxiety Depression    GI/Hepatic Neg liver ROS, GERD  Medicated,  Endo/Other  negative endocrine ROS  Renal/GU negative Renal ROS  negative genitourinary   Musculoskeletal negative musculoskeletal ROS (+)   Abdominal Normal abdominal exam  (+)   Peds negative pediatric ROS (+)  Hematology   Anesthesia Other Findings   Reproductive/Obstetrics negative OB ROS                            Anesthesia Physical Anesthesia Plan  ASA: II  Anesthesia Plan: MAC   Post-op Pain Management:    Induction: Intravenous  Airway Management Planned: Nasal Cannula  Additional Equipment:   Intra-op Plan:   Post-operative Plan:   Informed Consent: I have reviewed the patients History and Physical, chart, labs and discussed the procedure including the risks, benefits and alternatives for the proposed anesthesia with the patient or authorized representative who has indicated his/her understanding and acceptance.     Plan Discussed with: CRNA  Anesthesia Plan Comments:         Anesthesia Quick Evaluation

## 2015-05-04 ENCOUNTER — Encounter (HOSPITAL_COMMUNITY): Payer: Self-pay | Admitting: Gastroenterology

## 2015-06-26 ENCOUNTER — Encounter: Payer: Self-pay | Admitting: Family Medicine

## 2015-07-06 ENCOUNTER — Ambulatory Visit (INDEPENDENT_AMBULATORY_CARE_PROVIDER_SITE_OTHER): Payer: BLUE CROSS/BLUE SHIELD | Admitting: Family Medicine

## 2015-07-06 ENCOUNTER — Encounter: Payer: Self-pay | Admitting: Family Medicine

## 2015-07-06 VITALS — BP 104/60 | HR 78 | Temp 98.1°F | Resp 16 | Ht 71.25 in | Wt 148.4 lb

## 2015-07-06 DIAGNOSIS — R634 Abnormal weight loss: Secondary | ICD-10-CM | POA: Diagnosis not present

## 2015-07-06 DIAGNOSIS — K253 Acute gastric ulcer without hemorrhage or perforation: Secondary | ICD-10-CM

## 2015-07-06 DIAGNOSIS — Z720 Tobacco use: Secondary | ICD-10-CM

## 2015-07-06 DIAGNOSIS — Z13220 Encounter for screening for lipoid disorders: Secondary | ICD-10-CM

## 2015-07-06 DIAGNOSIS — F1910 Other psychoactive substance abuse, uncomplicated: Secondary | ICD-10-CM

## 2015-07-06 DIAGNOSIS — F411 Generalized anxiety disorder: Secondary | ICD-10-CM | POA: Diagnosis not present

## 2015-07-06 DIAGNOSIS — F1911 Other psychoactive substance abuse, in remission: Secondary | ICD-10-CM

## 2015-07-06 DIAGNOSIS — D509 Iron deficiency anemia, unspecified: Secondary | ICD-10-CM

## 2015-07-06 LAB — POCT CBC
GRANULOCYTE PERCENT: 60.5 % (ref 37–80)
HEMATOCRIT: 36.2 % — AB (ref 43.5–53.7)
Hemoglobin: 12.1 g/dL — AB (ref 14.1–18.1)
Lymph, poc: 1.3 (ref 0.6–3.4)
MCH, POC: 30.9 pg (ref 27–31.2)
MCHC: 33.6 g/dL (ref 31.8–35.4)
MCV: 91.9 fL (ref 80–97)
MID (CBC): 0.3 (ref 0–0.9)
MPV: 5.9 fL (ref 0–99.8)
POC GRANULOCYTE: 2.4 (ref 2–6.9)
POC LYMPH %: 32.9 % (ref 10–50)
POC MID %: 6.6 % (ref 0–12)
Platelet Count, POC: 301 10*3/uL (ref 142–424)
RBC: 3.94 M/uL — AB (ref 4.69–6.13)
RDW, POC: 18.2 %
WBC: 4 10*3/uL — AB (ref 4.6–10.2)

## 2015-07-06 LAB — COMPREHENSIVE METABOLIC PANEL
ALBUMIN: 4.4 g/dL (ref 3.6–5.1)
ALT: 15 U/L (ref 9–46)
AST: 14 U/L (ref 10–40)
Alkaline Phosphatase: 28 U/L — ABNORMAL LOW (ref 40–115)
BUN: 17 mg/dL (ref 7–25)
CALCIUM: 9.2 mg/dL (ref 8.6–10.3)
CHLORIDE: 109 mmol/L (ref 98–110)
CO2: 23 mmol/L (ref 20–31)
Creat: 0.87 mg/dL (ref 0.60–1.35)
Glucose, Bld: 93 mg/dL (ref 65–99)
POTASSIUM: 4.6 mmol/L (ref 3.5–5.3)
SODIUM: 142 mmol/L (ref 135–146)
Total Bilirubin: 0.2 mg/dL (ref 0.2–1.2)
Total Protein: 6.4 g/dL (ref 6.1–8.1)

## 2015-07-06 LAB — LIPID PANEL
Cholesterol: 209 mg/dL — ABNORMAL HIGH (ref 125–200)
HDL: 41 mg/dL (ref >=40)
LDL CALC: 136 mg/dL — AB (ref <130)
Total CHOL/HDL Ratio: 5.1 Ratio — ABNORMAL HIGH (ref <=5.0)
Triglycerides: 159 mg/dL — ABNORMAL HIGH (ref <150)
VLDL: 32 mg/dL — AB (ref <30)

## 2015-07-06 LAB — IBC PANEL
%SAT: 11 % — AB (ref 15–60)
TIBC: 473 ug/dL — AB (ref 250–425)
UIBC: 423 ug/dL — AB (ref 125–400)

## 2015-07-06 LAB — TSH: TSH: 0.64 m[IU]/L (ref 0.40–4.50)

## 2015-07-06 LAB — IRON: IRON: 50 ug/dL (ref 50–180)

## 2015-07-06 LAB — VITAMIN B12: VITAMIN B 12: 621 pg/mL (ref 200–1100)

## 2015-07-06 MED ORDER — SERTRALINE HCL 50 MG PO TABS: 50.0000 mg | ORAL_TABLET | Freq: Every day | ORAL | Status: DC

## 2015-07-06 NOTE — Patient Instructions (Signed)
     IF you received an x-ray today, you will receive an invoice from [facility]. Please contact [facility] at [phone] with questions or concerns regarding your invoice.   IF you received labwork today, you will receive an invoice from [facility]. Please contact [facility] at [phone] with questions or concerns regarding your invoice.   Our billing staff will not be able to assist you with questions regarding bills from these companies.  You will be contacted with the lab results as soon as they are available. The fastest way to get your results is to activate your My Chart account. Instructions are located on the last page of this paperwork. If you have not heard from us regarding the results in 2 weeks, please contact this office.      

## 2015-07-06 NOTE — Progress Notes (Signed)
Subjective:    Patient ID: Kyle Allen, male    DOB: 06/14/1970, 45 y.o.   MRN: [id number]  07/06/2015  Follow-up (anemia)   HPI This 45 y.o. male presents for follow-up:  1.  Weight loss: continues to lose weight.  Thinks part of it is taking Omeprazole.  Taking before dinner and before breakfast.  Snacks all day so then does no have much to eat for lunch.  No longer having abdominal pain.  Wakes up and washes face and hand; turns on news; then takes Omeprazole and iron tablet.  Sometimes also takes Zyrtec.  Then gets dressed for 5 minutes.  Goes to prepare lunch; then drink some water and head out the door.  Drive to work 3 minutes. Gets up at 6:00am; gets to work at 7:00am.  [employer].  Eats a huge dinner; stuffs self at dinner; eating dessert/ice cream.  Eating healthy dinner; no dinner; cooks dinner.  Has a big dinner; squash, zucchini.  Was eating out a lot prior to moving out of group home.   2.  Anemia/GI bleed: s/p EGD and colonoscopy 05/03/2015.  Two ulcers gastric; Omeprazole bid; no more bloating or pain. Normal colonoscopy.  3.  Anxiety: stopped taking No pleasure; no excitement.  Dreading going to AA.  Hears the same speakers over and over.  Felt really depressed.  Went through bills with mother; Sertraline seemed to help the most.  Started taking Sertraline one at night three months ago; did not care for Lexapro; caused fatigue.  Really knocked pt out.  Had to fight sleep.   Stopped going to [facility].  No counseling.  Frustrated with psychiatrist who wanted more medication.  Counselor was not a good Microbiologistmatch.  Scheduled at 8:00am; 30 minutes late for 8:00am appointment.  Coping well with living alone.  No relapses. Brother's best friend overdosed heroine.  Sudden and unexpected.  [place]    4.  HTN: not taking blood pressure medication.   Review of Systems  Constitutional: Positive for unexpected weight change. Negative for activity  change, appetite change, chills, diaphoresis, fatigue and fever.  Eyes: Negative for visual disturbance.  Respiratory: Negative for cough and shortness of breath.   Cardiovascular: Negative for chest pain, palpitations and leg swelling.  Gastrointestinal: Negative for abdominal distention, abdominal pain, anal bleeding, blood in stool, constipation, diarrhea, nausea, rectal pain and vomiting.  Endocrine: Negative for cold intolerance, heat intolerance, polydipsia, polyphagia and polyuria.  Neurological: Negative for dizziness, tremors, seizures, syncope, facial asymmetry, speech difficulty, weakness, light-headedness, numbness and headaches.    Past Medical History:  Diagnosis Date  . Allergy   . Anemia   . Anxiety   . Depression   . GERD (gastroesophageal reflux disease)   . History of kidney stones    episodes x1 -multiple stones- not a bother at this time.  . Hypertension    not on meds presently by MD action.  . Substance abuse    Past Surgical History:  Procedure Laterality Date  . COLONOSCOPY WITH PROPOFOL N/A 05/03/2015   Procedure: COLONOSCOPY WITH PROPOFOL;  Surgeon: Charolett BumpersMartin K Johnson, MD;  Location: [facility];  Service: Endoscopy;  Laterality: N/A;  . ESOPHAGOGASTRODUODENOSCOPY (EGD) WITH PROPOFOL N/A 05/03/2015   Procedure: ESOPHAGOGASTRODUODENOSCOPY (EGD) WITH PROPOFOL;  Surgeon: Charolett BumpersMartin K Johnson, MD;  Location: [facility];  Service: Endoscopy;  Laterality: N/A;   Allergies  Allergen Reactions  . Carafate [Sucralfate] Other (See Comments)    "Made sick on stomach"  Current Outpatient Prescriptions  Medication Sig Dispense Refill  . acetaminophen (TYLENOL) 500 MG tablet Take 1,000 mg by mouth every 6 (six) hours as needed (For headache.).    Marland Kitchen. fluticasone (FLONASE) 50 MCG/ACT nasal spray SHAKE WELL AND USE 2 SPRAYS IN EACH NOSTRIL DAILY 48 g 3  . Multiple Vitamin (MULTIVITAMIN WITH MINERALS) TABS tablet Take 1 tablet by mouth daily.    Marland Kitchen. omeprazole (PRILOSEC) 20 MG  capsule Take 1 capsule (20 mg total) by mouth 2 (two) times daily before a meal. 60 capsule 5  . triamcinolone ointment (KENALOG) 0.1 % Apply 1 application topically 2 (two) times daily as needed (Applies to affected area on lips.).   2  . escitalopram (LEXAPRO) 20 MG tablet TAKE 1 TABLET(20 MG) BY MOUTH AT BEDTIME (Patient not taking: Reported on 07/06/2015) 90 tablet 1  . sertraline (ZOLOFT) 100 MG tablet Take 2 tablets (200 mg total) by mouth daily. 60 tablet 5   No current facility-administered medications for this visit.    Social History   Social History  . Marital status: Single    Spouse name: N/A  . Number of children: N/A  . Years of education: N/A   Occupational History  . Not on file.   Social History Main Topics  . Smoking status: Current Every Day Smoker    Packs/day: 1.00    Years: 20.00    Types: Cigarettes  . Smokeless tobacco: Never Used     Comment: Opiate use   . Alcohol use No     Comment: Quit  2015 also.  . Drug use:     Types: Marijuana, Other-see comments, Heroin     Comment: Quit 04-02-13 Attends AA  . Sexual activity: Not Currently   Other Topics Concern  . Not on file   Social History Narrative   Marital status: single; not dating.  [place]       Children:  None       Lives: at [facility] since 07/2013.  Group home of 8 men in recovery.      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      Tobacco:  1 ppd x 20 years.  Never quit.      Alcohol: in recovery; drinking 1/5 per day; duration 20 years; DWIs x 5; last DWI 09/2009.  Gets license back this month; four years without license.      Drugs:  Marijuana, prescription pills (pain medications Oxycontin, Vicodin, some benzos).  No heroine or cocaine.      Exercise:  Six months; joined [employer]; cardio to start; free weights   Family History  Problem Relation Age of Onset  . Hyperlipidemia Mother   . Hypertension Mother   .  Hyperlipidemia Father   . Hypertension Father   . Hyperlipidemia Brother   . Hypertension Brother   . Cancer Maternal Grandmother   . Hyperlipidemia Maternal Grandmother   . Hypertension Maternal Grandmother   . Hyperlipidemia Maternal Grandfather   . Hypertension Maternal Grandfather   . Diabetes Maternal Grandfather   . Hyperlipidemia Paternal Grandmother   . Hypertension Paternal Grandmother   . Hyperlipidemia Paternal Grandfather   . Hypertension Paternal Grandfather   . Heart disease Paternal Grandfather        Objective:    BP 104/60   Pulse 78   Temp 98.1 F (36.7 C) (Oral)   Resp 16   Ht 5' 11.25" (1.81 m)   Wt  148 lb 6.4 oz (67.3 kg)   SpO2 97%   BMI 20.55 kg/m  Physical Exam  Constitutional: He is oriented to person, place, and time. He appears well-developed and well-nourished. No distress.  HENT:  Head: Normocephalic and atraumatic.  Right Ear: External ear normal.  Left Ear: External ear normal.  Nose: Nose normal.  Mouth/Throat: Oropharynx is clear and moist.  Eyes: Conjunctivae and EOM are normal. Pupils are equal, round, and reactive to light.  Neck: Normal range of motion. Neck supple. Carotid bruit is not present. No thyromegaly present.  Cardiovascular: Normal rate, regular rhythm, normal heart sounds and intact distal pulses.  Exam reveals no gallop and no friction rub.   No murmur heard. Pulmonary/Chest: Effort normal and breath sounds normal. He has no wheezes. He has no rales.  Abdominal: Soft. Bowel sounds are normal. He exhibits no distension and no mass. There is no tenderness. There is no rebound and no guarding.  Lymphadenopathy:    He has no cervical adenopathy.  Neurological: He is alert and oriented to person, place, and time. No cranial nerve deficit.  Skin: Skin is warm and dry. No rash noted. He is not diaphoretic.  Psychiatric: He has a normal mood and affect. His behavior is normal. Judgment and thought content normal.  Nursing note  and vitals reviewed.       Assessment & Plan:   1. Anxiety state   2. Tobacco abuse   3. Substance abuse in remission   4. Unintentional weight loss   5. Anemia, iron deficiency   6. Screening, lipid    -improving anxiety with switch from Lexapro to Sertraline; refill provided; no longer seeing psychiatry and counselor. -continues to attend AA.  No recent relapses. -obtain labs; anemia is slowly improving. -consider decrease of PPI to once daily; feel bid PPI affecting iron absorption; recommend patient take iron supplement separate from PPI. -close follow-up. -weight loss appears multifactorial due to recent PUD, medication regimen, change in living situation.  If weight loss persists, will warrant CT chest/abdomen/pelvis to rule out malignancy.    Orders Placed This Encounter  Procedures  . Comprehensive metabolic panel  . TSH  . Iron  . IBC panel  . Vitamin B12  . VITAMIN D 25 Hydroxy (Vit-D Deficiency, Fractures)  . Lipid panel    Order Specific Question:   Has the patient fasted?    Answer:   Yes  . POCT CBC   Meds ordered this encounter  Medications  . DISCONTD: sertraline (ZOLOFT) 50 MG tablet    Sig: Take 1 tablet (50 mg total) by mouth daily.    Dispense:  30 tablet    Refill:  3    Return in about 4 weeks (around 08/03/2015) for recheck weight.    Leeasia Secrist Paulita Fujita, M.D. [facility] [address] [place], [place]  [address] [phone] phone [phone] fax

## 2015-07-07 LAB — VITAMIN D 25 HYDROXY (VIT D DEFICIENCY, FRACTURES): Vit D, 25-Hydroxy: 40 ng/mL (ref 30–100)

## 2015-07-11 MED ORDER — SERTRALINE HCL 100 MG PO TABS: 200.0000 mg | ORAL_TABLET | Freq: Every day | ORAL | Status: DC

## 2015-07-12 ENCOUNTER — Encounter: Payer: Self-pay | Admitting: Family Medicine

## 2015-07-18 ENCOUNTER — Encounter: Payer: Self-pay | Admitting: Family Medicine

## 2015-07-31 DIAGNOSIS — K253 Acute gastric ulcer without hemorrhage or perforation: Secondary | ICD-10-CM | POA: Insufficient documentation

## 2015-08-16 ENCOUNTER — Ambulatory Visit (INDEPENDENT_AMBULATORY_CARE_PROVIDER_SITE_OTHER): Payer: BLUE CROSS/BLUE SHIELD | Admitting: Family Medicine

## 2015-08-16 ENCOUNTER — Encounter: Payer: Self-pay | Admitting: Family Medicine

## 2015-08-16 VITALS — BP 122/66 | HR 69 | Temp 99.0°F | Resp 16 | Ht 71.0 in | Wt 159.0 lb

## 2015-08-16 DIAGNOSIS — D509 Iron deficiency anemia, unspecified: Secondary | ICD-10-CM | POA: Diagnosis not present

## 2015-08-16 DIAGNOSIS — K253 Acute gastric ulcer without hemorrhage or perforation: Secondary | ICD-10-CM | POA: Diagnosis not present

## 2015-08-16 DIAGNOSIS — R634 Abnormal weight loss: Secondary | ICD-10-CM

## 2015-08-16 LAB — POCT CBC
Granulocyte percent: 40.2 %G (ref 37–80)
HCT, POC: 36.1 % — AB (ref 43.5–53.7)
Hemoglobin: 11.9 g/dL — AB (ref 14.1–18.1)
LYMPH, POC: 2.3 (ref 0.6–3.4)
MCH, POC: 31.9 pg — AB (ref 27–31.2)
MCHC: 33 g/dL (ref 31.8–35.4)
MCV: 96.8 fL (ref 80–97)
MID (CBC): 0.4 (ref 0–0.9)
MPV: 6.6 fL (ref 0–99.8)
PLATELET COUNT, POC: 222 10*3/uL (ref 142–424)
POC Granulocyte: 1.8 — AB (ref 2–6.9)
POC LYMPH %: 51 % — AB (ref 10–50)
POC MID %: 8.8 % (ref 0–12)
RBC: 3.73 M/uL — AB (ref 4.69–6.13)
RDW, POC: 20 %
WBC: 4.6 10*3/uL (ref 4.6–10.2)

## 2015-08-16 NOTE — Progress Notes (Signed)
Subjective:    Patient ID: Kyle Allen, male    DOB: 01/08/1970, 45 y.o.   MRN: [id number]  08/16/2015  Follow-up (weight)   HPI This 45 y.o. male presents for follow-up of the following:  1.  Gastric Ulcer; takes Prilosec at 5:30am; does not need to get to work until 8:00am.  Eats at [employer]:00am.  Drinks the whole time in the morning.  Can eat at 6:30am; jimmy dean biscuits and bowl with eggs and potatoes, ensure.  Lunch: light lunch.  Supper:  Likes meal in a bag; hard to cook for one person; does a lot of baked chicken.  Ensure.  Ice cream bowl.  Takes Omeprazole before dinner; around 6;00pm; takes second Zoloft at 6:00pm as well.  Must eat before AA meeting.  Trying to stay on routine. Has gained 11 pounds since last visit one month ago.    2. Anemia iron deficiency: taking supplement one qod. Not taking B12.  Taking MVI every morning.    3.  Anxiety and depression: Patient reports good compliance with medication, good tolerance to medication, and good symptom control.  [employer].     Review of Systems  Constitutional: Negative for activity change, appetite change, chills, diaphoresis, fatigue and fever.  Respiratory: Negative for cough and shortness of breath.   Cardiovascular: Negative for chest pain, palpitations and leg swelling.  Gastrointestinal: Negative for abdominal pain, diarrhea, nausea and vomiting.  Endocrine: Negative for cold intolerance, heat intolerance, polydipsia, polyphagia and polyuria.  Skin: Negative for color change, rash and wound.  Neurological: Negative for dizziness, tremors, seizures, syncope, facial asymmetry, speech difficulty, weakness, light-headedness, numbness and headaches.  Psychiatric/Behavioral: Negative for dysphoric mood and sleep disturbance. The patient is not nervous/anxious.     Past Medical History:  Diagnosis Date  . Allergy   . Anemia   . Anxiety   . Depression   . GERD (gastroesophageal reflux disease)   . History of  kidney stones    episodes x1 -multiple stones- not a bother at this time.  . Hypertension    not on meds presently by MD action.  . Substance abuse    Past Surgical History:  Procedure Laterality Date  . COLONOSCOPY WITH PROPOFOL N/A 05/03/2015   Procedure: COLONOSCOPY WITH PROPOFOL;  Surgeon: Charolett BumpersMartin K Johnson, MD;  Location: [facility];  Service: Endoscopy;  Laterality: N/A;  . ESOPHAGOGASTRODUODENOSCOPY (EGD) WITH PROPOFOL N/A 05/03/2015   Procedure: ESOPHAGOGASTRODUODENOSCOPY (EGD) WITH PROPOFOL;  Surgeon: Charolett BumpersMartin K Johnson, MD;  Location: [facility];  Service: Endoscopy;  Laterality: N/A;   Allergies  Allergen Reactions  . Carafate [Sucralfate] Other (See Comments)    "Made sick on stomach"   Current Outpatient Prescriptions  Medication Sig Dispense Refill  . acetaminophen (TYLENOL) 500 MG tablet Take 1,000 mg by mouth every 6 (six) hours as needed (For headache.).    Marland Kitchen. fluticasone (FLONASE) 50 MCG/ACT nasal spray SHAKE WELL AND USE 2 SPRAYS IN EACH NOSTRIL DAILY 48 g 3  . Multiple Vitamin (MULTIVITAMIN WITH MINERALS) TABS tablet Take 1 tablet by mouth daily.    . sertraline (ZOLOFT) 100 MG tablet Take 2 tablets (200 mg total) by mouth daily. 60 tablet 5  . escitalopram (LEXAPRO) 20 MG tablet TAKE 1 TABLET(20 MG) BY MOUTH AT BEDTIME (Patient not taking: Reported on 07/06/2015) 90 tablet 1  . omeprazole (PRILOSEC) 20 MG capsule TAKE 1 CAPSULE(20 MG) BY MOUTH TWICE DAILY BEFORE A MEAL 60 capsule 5  . triamcinolone ointment (KENALOG) 0.1 % Apply 1  application topically 2 (two) times daily as needed (Applies to affected area on lips.).   2   No current facility-administered medications for this visit.    Social History   Social History  . Marital status: Single    Spouse name: N/A  . Number of children: N/A  . Years of education: N/A   Occupational History  . Not on file.   Social History Main Topics  . Smoking status: Current Every Day Smoker    Packs/day: 1.00    Years:  20.00    Types: Cigarettes  . Smokeless tobacco: Never Used     Comment: Opiate use   . Alcohol use No     Comment: Quit  2015 also.  . Drug use:     Types: Marijuana, Other-see comments, Heroin     Comment: Quit 04-02-13 Attends AA  . Sexual activity: Not Currently   Other Topics Concern  . Not on file   Social History Narrative   Marital status: single; not dating.  [place]       Children:  None       Lives: at [facility] since 07/2013.  Group home of 8 men in recovery.      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      Tobacco:  1 ppd x 20 years.  Never quit.      Alcohol: in recovery; drinking 1/5 per day; duration 20 years; DWIs x 5; last DWI 09/2009.  Gets license back this month; four years without license.      Drugs:  Marijuana, prescription pills (pain medications Oxycontin, Vicodin, some benzos).  No heroine or cocaine.      Exercise:  Six months; joined [employer]; cardio to start; free weights   Family History  Problem Relation Age of Onset  . Hyperlipidemia Mother   . Hypertension Mother   . Hyperlipidemia Father   . Hypertension Father   . Hyperlipidemia Brother   . Hypertension Brother   . Cancer Maternal Grandmother   . Hyperlipidemia Maternal Grandmother   . Hypertension Maternal Grandmother   . Hyperlipidemia Maternal Grandfather   . Hypertension Maternal Grandfather   . Diabetes Maternal Grandfather   . Hyperlipidemia Paternal Grandmother   . Hypertension Paternal Grandmother   . Hyperlipidemia Paternal Grandfather   . Hypertension Paternal Grandfather   . Heart disease Paternal Grandfather        Objective:    BP 122/66 (BP Location: Right Arm, Patient Position: Sitting, Cuff Size: Normal)   Pulse 69   Temp 99 F (37.2 C) (Oral)   Resp 16   Ht 5\' 11"  (1.803 m)   Wt 159 lb (72.1 kg)   SpO2 99%   BMI 22.18 kg/m  Physical Exam  Constitutional: He is oriented to person,  place, and time. He appears well-developed and well-nourished. No distress.  HENT:  Head: Normocephalic and atraumatic.  Right Ear: External ear normal.  Left Ear: External ear normal.  Nose: Nose normal.  Mouth/Throat: Oropharynx is clear and moist.  Eyes: Conjunctivae and EOM are normal. Pupils are equal, round, and reactive to light.  Neck: Normal range of motion. Neck supple. Carotid bruit is not present. No thyromegaly present.  Cardiovascular: Normal rate, regular rhythm, normal heart sounds and intact distal pulses.  Exam reveals no gallop and no friction rub.   No murmur heard. Pulmonary/Chest: Effort normal and breath sounds normal. He has no wheezes. He has no  rales.  Abdominal: Soft. Bowel sounds are normal. He exhibits no distension and no mass. There is no tenderness. There is no rebound and no guarding.  Lymphadenopathy:    He has no cervical adenopathy.  Neurological: He is alert and oriented to person, place, and time. No cranial nerve deficit.  Skin: Skin is warm and dry. No rash noted. He is not diaphoretic.  Psychiatric: He has a normal mood and affect. His behavior is normal.  Nursing note and vitals reviewed.  Results for orders placed or performed in visit on 08/16/15  POCT CBC  Result Value Ref Range   WBC 4.6 4.6 - 10.2 K/uL   Lymph, poc 2.3 0.6 - 3.4   POC LYMPH PERCENT 51.0 (A) 10 - 50 %L   MID (cbc) 0.4 0 - 0.9   POC MID % 8.8 0 - 12 %M   POC Granulocyte 1.8 (A) 2 - 6.9   Granulocyte percent 40.2 37 - 80 %G   RBC 3.73 (A) 4.69 - 6.13 M/uL   Hemoglobin 11.9 (A) 14.1 - 18.1 g/dL   HCT, POC 16.1 (A) 09.6 - 53.7 %   MCV 96.8 80 - 97 fL   MCH, POC 31.9 (A) 27 - 31.2 pg   MCHC 33.0 31.8 - 35.4 g/dL   RDW, POC 04.5 %   Platelet Count, POC 222 142 - 424 K/uL   MPV 6.6 0 - 99.8 fL   Wt Readings from Last 3 Encounters:  08/16/15 159 lb (72.1 kg)  07/06/15 148 lb 6.4 oz (67.3 kg)  05/03/15 160 lb (72.6 kg)      Assessment & Plan:   1. Acute gastric  ulcer   2. Anemia, iron deficiency   3. Unintentional weight loss    -Improving. -continue current medications. -close follow-up for repeat weight check in 3 months.  Orders Placed This Encounter  Procedures  . POCT CBC   No orders of the defined types were placed in this encounter.   Return in about 3 months (around 11/16/2015) for recheck weight check.   Preslie Depasquale Paulita Fujita, M.D. [facility] [address] [place], [place]  [address] [phone] phone [phone] fax

## 2015-08-16 NOTE — Patient Instructions (Signed)
     IF you received an x-ray today, you will receive an invoice from [facility]. Please contact [facility] at [phone] with questions or concerns regarding your invoice.   IF you received labwork today, you will receive an invoice from [facility]. Please contact [facility] at [phone] with questions or concerns regarding your invoice.   Our billing staff will not be able to assist you with questions regarding bills from these companies.  You will be contacted with the lab results as soon as they are available. The fastest way to get your results is to activate your My Chart account. Instructions are located on the last page of this paperwork. If you have not heard from us regarding the results in 2 weeks, please contact this office.      

## 2015-08-17 ENCOUNTER — Other Ambulatory Visit: Payer: Self-pay | Admitting: Family Medicine

## 2015-08-22 ENCOUNTER — Encounter: Payer: Self-pay | Admitting: Family Medicine

## 2015-09-15 ENCOUNTER — Encounter: Payer: Self-pay | Admitting: Family Medicine

## 2015-09-29 ENCOUNTER — Encounter: Payer: Self-pay | Admitting: Family Medicine

## 2015-10-03 ENCOUNTER — Encounter: Payer: Self-pay | Admitting: Family Medicine

## 2015-10-07 MED ORDER — MUPIROCIN 2 % EX OINT: 1.0000 "application " | TOPICAL_OINTMENT | Freq: Three times a day (TID) | CUTANEOUS | 3 refills | Status: DC

## 2015-10-09 ENCOUNTER — Other Ambulatory Visit: Payer: Self-pay | Admitting: Family Medicine

## 2015-10-13 DIAGNOSIS — Z23 Encounter for immunization: Secondary | ICD-10-CM | POA: Diagnosis not present

## 2015-11-01 ENCOUNTER — Ambulatory Visit: Payer: Self-pay | Admitting: Family Medicine

## 2015-11-02 ENCOUNTER — Other Ambulatory Visit: Payer: Self-pay | Admitting: Family Medicine

## 2015-11-08 ENCOUNTER — Other Ambulatory Visit: Payer: Self-pay

## 2015-11-08 ENCOUNTER — Ambulatory Visit (INDEPENDENT_AMBULATORY_CARE_PROVIDER_SITE_OTHER): Payer: BLUE CROSS/BLUE SHIELD | Admitting: Family Medicine

## 2015-11-08 ENCOUNTER — Encounter: Payer: Self-pay | Admitting: Family Medicine

## 2015-11-08 VITALS — BP 130/80 | HR 70 | Temp 98.6°F | Resp 17 | Ht 71.0 in | Wt 160.0 lb

## 2015-11-08 DIAGNOSIS — R634 Abnormal weight loss: Secondary | ICD-10-CM | POA: Diagnosis not present

## 2015-11-08 DIAGNOSIS — Z720 Tobacco use: Secondary | ICD-10-CM

## 2015-11-08 DIAGNOSIS — F418 Other specified anxiety disorders: Secondary | ICD-10-CM | POA: Diagnosis not present

## 2015-11-08 DIAGNOSIS — F5104 Psychophysiologic insomnia: Secondary | ICD-10-CM

## 2015-11-08 DIAGNOSIS — H918X3 Other specified hearing loss, bilateral: Secondary | ICD-10-CM

## 2015-11-08 DIAGNOSIS — F329 Major depressive disorder, single episode, unspecified: Secondary | ICD-10-CM

## 2015-11-08 DIAGNOSIS — B001 Herpesviral vesicular dermatitis: Secondary | ICD-10-CM | POA: Diagnosis not present

## 2015-11-08 DIAGNOSIS — F32A Depression, unspecified: Secondary | ICD-10-CM

## 2015-11-08 DIAGNOSIS — F419 Anxiety disorder, unspecified: Principal | ICD-10-CM

## 2015-11-08 DIAGNOSIS — F1911 Other psychoactive substance abuse, in remission: Secondary | ICD-10-CM

## 2015-11-08 DIAGNOSIS — K253 Acute gastric ulcer without hemorrhage or perforation: Secondary | ICD-10-CM

## 2015-11-08 MED ORDER — SERTRALINE HCL 100 MG PO TABS: 200.0000 mg | ORAL_TABLET | Freq: Every day | ORAL | 5 refills | Status: DC

## 2015-11-08 MED ORDER — TRAZODONE HCL 50 MG PO TABS: 50.0000 mg | ORAL_TABLET | Freq: Every evening | ORAL | 5 refills | Status: DC | PRN

## 2015-11-08 NOTE — Progress Notes (Signed)
Subjective:    Patient ID: Kyle Allen, male    DOB: [birth year], 45 y.o.   MRN: [id number]  11/08/2015  Follow-up (weight and blister on lip)   HPI This 45 y.o. male presents for three month follow-up of unintentional weight loss, gastric ulcer with iron deficiency anemia, anxiety and depression, recurrent herpes labialis lower lip.   Does not like to time change.  Cannot gain any weight; drinking ensure at night; also drinking pedialyte.  Father bought free weights; cancelled membership at gym; had not every stepped foot into the gym.   Uses cash strictly.  Does not balance check book.  Gets up at 5:30am; takes GERD medication/Prilosec immediately.  Taking Sertraline 21mnutes after prilosec.  Then with supper, takes Prilosec; then 30 minutes later, takes Sertraline.  Then gets in shower, then can eat.  Also taking iron and multivitamin; takes in the mornings after breakfast.  Has been doing well emotionally; visits home a lot.  Social media and facebook alerts people that pt is in town.  Does not answer 910 numbers. Had to cut off certain people.  Anxiety is intermittent.  Has anxiety about certain circumstances.  Still attending [employer].   BP Readings from Last 3 Encounters:  11/08/15 130/80  08/16/15 122/66  07/06/15 104/60   Wt Readings from Last 3 Encounters:  11/08/15 160 lb (72.6 kg)  08/16/15 159 lb (72.1 kg)  07/06/15 148 lb 6.4 oz (67.3 kg)    Hearing loss: went home this weekend; had a really hard time hearing people.  Spent 40 dollars on cerumen impaction.  Feels like ears are full of wax.  Cannot hear.  Could not hear mother.  Could not hear anybody.  Onset for a while.  No loud music exposure.  Biggest concern is hearing loss.      Review of Systems  Constitutional: Negative for activity change, appetite change, chills, diaphoresis, fatigue and fever.  HENT: Positive for hearing loss. Negative for congestion, ear discharge, ear pain, postnasal drip, rhinorrhea, sinus  pain, sinus pressure, sneezing, sore throat, tinnitus, trouble swallowing and voice change.   Eyes: Negative for visual disturbance.  Respiratory: Negative for cough and shortness of breath.   Cardiovascular: Negative for chest pain, palpitations and leg swelling.  Gastrointestinal: Negative for abdominal distention, abdominal pain, anal bleeding, blood in stool, constipation, diarrhea, nausea, rectal pain and vomiting.  Endocrine: Negative for cold intolerance, heat intolerance, polydipsia, polyphagia and polyuria.  Skin: Positive for wound.  Neurological: Negative for dizziness, tremors, seizures, syncope, facial asymmetry, speech difficulty, weakness, light-headedness, numbness and headaches.  Psychiatric/Behavioral: Positive for dysphoric mood and sleep disturbance. Negative for self-injury and suicidal ideas. The patient is nervous/anxious.     Past Medical History:  Diagnosis Date  . Allergy   . Anemia   . Anxiety   . Depression   . GERD (gastroesophageal reflux disease)   . History of kidney stones    episodes x1 -multiple stones- not a bother at this time.  . Hypertension    not on meds presently by MD action.  . Substance abuse    Past Surgical History:  Procedure Laterality Date  . COLONOSCOPY WITH PROPOFOL N/A 05/03/2015   Procedure: COLONOSCOPY WITH PROPOFOL;  Surgeon: MGarlan Fair MD;  Location: [facility];  Service: Endoscopy;  Laterality: N/A;  . ESOPHAGOGASTRODUODENOSCOPY (EGD) WITH PROPOFOL N/A 05/03/2015   Procedure: ESOPHAGOGASTRODUODENOSCOPY (EGD) WITH PROPOFOL;  Surgeon: MGarlan Fair MD;  Location: [facility];  Service: Endoscopy;  Laterality: N/A;  Allergies  Allergen Reactions  . Carafate [Sucralfate] Other (See Comments)    "Made sick on stomach"    Social History   Social History  . Marital status: Single    Spouse name: N/A  . Number of children: N/A  . Years of education: N/A   Occupational History  . Not on file.   Social History  Main Topics  . Smoking status: Current Every Day Smoker    Packs/day: 1.00    Years: 20.00    Types: Cigarettes  . Smokeless tobacco: Never Used     Comment: Opiate use   . Alcohol use No     Comment: Quit  2015 also.  . Drug use:     Types: Marijuana, Other-see comments, Heroin     Comment: Quit 04-02-13 Attends AA  . Sexual activity: Not Currently   Other Topics Concern  . Not on file   Social History Narrative   Marital status: single; not dating.  From [place], [place].       Children:  None       Lives: at [facility] since 07/2013.  Group home of 8 men in recovery.      Employment:  Unemployed; applying for job maintenance at [employer].      Tobacco:  1 ppd x 20 years.  Never quit.      Alcohol: in recovery; drinking 1/5 per day; duration 20 years; DWIs x 5; last DWI 09/2009.  Gets license back this month; four years without license.      Drugs:  Marijuana, prescription pills (pain medications Oxycontin, Vicodin, some benzos).  No heroine or cocaine.      Exercise:  Six months; joined [employer]; cardio to start; free weights   Family History  Problem Relation Age of Onset  . Hyperlipidemia Mother   . Hypertension Mother   . Hyperlipidemia Father   . Hypertension Father   . Hyperlipidemia Brother   . Hypertension Brother   . Cancer Maternal Grandmother   . Hyperlipidemia Maternal Grandmother   . Hypertension Maternal Grandmother   . Hyperlipidemia Maternal Grandfather   . Hypertension Maternal Grandfather   . Diabetes Maternal Grandfather   . Hyperlipidemia Paternal Grandmother   . Hypertension Paternal Grandmother   . Hyperlipidemia Paternal Grandfather   . Hypertension Paternal Grandfather   . Heart disease Paternal Grandfather        Objective:    BP 130/80 (BP Location: Left Arm, Patient Position: Sitting, Cuff Size: Normal)   Pulse 70   Temp 98.6 F (37 C) (Oral)   Resp 17   Ht 5' 11"  (1.803 m)   Wt 160 lb (72.6 kg)    SpO2 97%   BMI 22.32 kg/m  Physical Exam  Constitutional: He is oriented to person, place, and time. He appears well-developed and well-nourished. No distress.  HENT:  Head: Normocephalic and atraumatic.  Right Ear: External ear normal.  Left Ear: External ear normal.  Nose: Nose normal.  Mouth/Throat: Oropharynx is clear and moist.    Well healing wound midline lower lip with scarring.  Eyes: Conjunctivae and EOM are normal. Pupils are equal, round, and reactive to light.  Neck: Normal range of motion. Neck supple. Carotid bruit is not present. No thyromegaly present.  Cardiovascular: Normal rate, regular rhythm, normal heart sounds and intact distal pulses.  Exam reveals no gallop and no friction rub.   No murmur heard. Pulmonary/Chest: Effort normal and breath sounds normal. He has no wheezes.  He has no rales.  Abdominal: Soft. Bowel sounds are normal. He exhibits no distension and no mass. There is no tenderness. There is no rebound and no guarding.  Lymphadenopathy:    He has no cervical adenopathy.  Neurological: He is alert and oriented to person, place, and time. No cranial nerve deficit. He exhibits normal muscle tone. Coordination normal.  Skin: Skin is warm and dry. No rash noted. He is not diaphoretic.  Psychiatric: He has a normal mood and affect. His behavior is normal. Judgment and thought content normal.  Nursing note and vitals reviewed.  Results for orders placed or performed in visit on 11/08/15  CBC with Differential/Platelet  Result Value Ref Range   WBC 4.4 3.8 - 10.8 K/uL   RBC 3.72 (L) 4.20 - 5.80 MIL/uL   Hemoglobin 12.2 (L) 13.2 - 17.1 g/dL   HCT 36.3 (L) 38.5 - 50.0 %   MCV 97.6 80.0 - 100.0 fL   MCH 32.8 27.0 - 33.0 pg   MCHC 33.6 32.0 - 36.0 g/dL   RDW 15.5 (H) 11.0 - 15.0 %   Platelets 278 140 - 400 K/uL   MPV 8.6 7.5 - 12.5 fL   Neutro Abs 2,288 1,500 - 7,800 cells/uL   Lymphs Abs 1,628 850 - 3,900 cells/uL   Monocytes Absolute 352 200 - 950  cells/uL   Eosinophils Absolute 88 15 - 500 cells/uL   Basophils Absolute 44 0 - 200 cells/uL   Neutrophils Relative % 52 %   Lymphocytes Relative 37 %   Monocytes Relative 8 %   Eosinophils Relative 2 %   Basophils Relative 1 %   Smear Review Criteria for review not met   Comprehensive metabolic panel  Result Value Ref Range   Sodium 141 135 - 146 mmol/L   Potassium 4.7 3.5 - 5.3 mmol/L   Chloride 109 98 - 110 mmol/L   CO2 20 20 - 31 mmol/L   Glucose, Bld 88 65 - 99 mg/dL   BUN 20 7 - 25 mg/dL   Creat 1.04 0.60 - 1.35 mg/dL   Total Bilirubin 0.2 0.2 - 1.2 mg/dL   Alkaline Phosphatase 25 (L) 40 - 115 U/L   AST 17 10 - 40 U/L   ALT 12 9 - 46 U/L   Total Protein 6.3 6.1 - 8.1 g/dL   Albumin 4.4 3.6 - 5.1 g/dL   Calcium 8.8 8.6 - 10.3 mg/dL       Assessment & Plan:   1. Anxiety and depression   2. Psychophysiological insomnia   3. Substance abuse in remission   4. Acute gastric ulcer without hemorrhage or perforation   5. Tobacco abuse   6. Herpes labialis   7. Unintentional weight loss   8. Other specified hearing loss of both ears    -stable anxiety and depression; continue current medication; rx for Trazodone provided for insomnia that has recurred. -ongoing sobriety; denies recent substance abuse; continues to attend nightly AA meetings.  Good family support. -recommend decreasing PPI to once daily. -weight is stable at this time. -progressive hearing loss; refer to ENT for further evaluation. -HSV labialis with recurrence; improvement now with current treatment; s/p dermatology consultation in past year with biopsy; no malignancy identified.   Orders Placed This Encounter  Procedures  . CBC with Differential/Platelet  . Comprehensive metabolic panel  . Ambulatory referral to ENT    Referral Priority:   Routine    Referral Type:   Consultation    Referral  Reason:   Specialty Services Required    Requested Specialty:   Otolaryngology    Number of Visits  Requested:   1   Meds ordered this encounter  Medications  . DISCONTD: sertraline (ZOLOFT) 100 MG tablet    Sig: 2 (two) times daily.    Refill:  4  . traZODone (DESYREL) 50 MG tablet    Sig: Take 1 tablet (50 mg total) by mouth at bedtime as needed for sleep.    Dispense:  30 tablet    Refill:  5  . sertraline (ZOLOFT) 100 MG tablet    Sig: Take 2 tablets (200 mg total) by mouth daily.    Dispense:  60 tablet    Refill:  5    Return in about 4 months (around 03/07/2016) for recheck.   Gokul Waybright Elayne Guerin, M.D. [facility] [address] [place], [place]  [address] [phone] phone [phone] fax

## 2015-11-08 NOTE — Telephone Encounter (Signed)
07/2015 last ov Scheduled today

## 2015-11-08 NOTE — Patient Instructions (Addendum)
  1. Decrease Omeprazole/Prilosec 20mg  to one daily every morning upon awakening. 2.  Change Sertraline 100mg  two tablets every evening. 3.  Then continue iron and multivitamin after breakfast.     IF you received an x-ray today, you will receive an invoice from [facility]. Please contact [facility] at [phone] with questions or concerns regarding your invoice.   IF you received labwork today, you will receive an invoice from [facility]. Please contact [facility] at [phone] with questions or concerns regarding your invoice.   Our billing staff will not be able to assist you with questions regarding bills from these companies.  You will be contacted with the lab results as soon as they are available. The fastest way to get your results is to activate your My Chart account. Instructions are located on the last page of this paperwork. If you have not heard from us regarding the results in 2 weeks, please contact this office.

## 2015-11-09 LAB — CBC WITH DIFFERENTIAL/PLATELET
BASOS ABS: 44 {cells}/uL (ref 0–200)
Basophils Relative: 1 %
EOS PCT: 2 %
Eosinophils Absolute: 88 cells/uL (ref 15–500)
HCT: 36.3 % — ABNORMAL LOW (ref 38.5–50.0)
HEMOGLOBIN: 12.2 g/dL — AB (ref 13.2–17.1)
LYMPHS ABS: 1628 {cells}/uL (ref 850–3900)
Lymphocytes Relative: 37 %
MCH: 32.8 pg (ref 27.0–33.0)
MCHC: 33.6 g/dL (ref 32.0–36.0)
MCV: 97.6 fL (ref 80.0–100.0)
MPV: 8.6 fL (ref 7.5–12.5)
Monocytes Absolute: 352 cells/uL (ref 200–950)
Monocytes Relative: 8 %
NEUTROS PCT: 52 %
Neutro Abs: 2288 cells/uL (ref 1500–7800)
PLATELETS: 278 10*3/uL (ref 140–400)
RBC: 3.72 MIL/uL — AB (ref 4.20–5.80)
RDW: 15.5 % — AB (ref 11.0–15.0)
WBC: 4.4 10*3/uL (ref 3.8–10.8)

## 2015-11-09 LAB — COMPREHENSIVE METABOLIC PANEL
ALBUMIN: 4.4 g/dL (ref 3.6–5.1)
ALT: 12 U/L (ref 9–46)
AST: 17 U/L (ref 10–40)
Alkaline Phosphatase: 25 U/L — ABNORMAL LOW (ref 40–115)
BILIRUBIN TOTAL: 0.2 mg/dL (ref 0.2–1.2)
BUN: 20 mg/dL (ref 7–25)
CHLORIDE: 109 mmol/L (ref 98–110)
CO2: 20 mmol/L (ref 20–31)
CREATININE: 1.04 mg/dL (ref 0.60–1.35)
Calcium: 8.8 mg/dL (ref 8.6–10.3)
GLUCOSE: 88 mg/dL (ref 65–99)
Potassium: 4.7 mmol/L (ref 3.5–5.3)
Sodium: 141 mmol/L (ref 135–146)
Total Protein: 6.3 g/dL (ref 6.1–8.1)

## 2015-11-15 ENCOUNTER — Encounter: Payer: Self-pay | Admitting: Family Medicine

## 2015-11-21 ENCOUNTER — Telehealth: Payer: Self-pay

## 2015-11-21 NOTE — Telephone Encounter (Signed)
OV notes for ENT referral are not complete. OV was 11/7. Thank you!

## 2015-11-30 ENCOUNTER — Other Ambulatory Visit: Payer: Self-pay | Admitting: Family Medicine

## 2015-12-02 NOTE — Telephone Encounter (Signed)
Note completed 

## 2016-01-06 ENCOUNTER — Other Ambulatory Visit: Payer: Self-pay | Admitting: Family Medicine

## 2016-01-24 ENCOUNTER — Encounter: Payer: Self-pay | Admitting: Family Medicine

## 2016-01-29 ENCOUNTER — Other Ambulatory Visit: Payer: Self-pay | Admitting: Family Medicine

## 2016-02-08 ENCOUNTER — Ambulatory Visit (INDEPENDENT_AMBULATORY_CARE_PROVIDER_SITE_OTHER): Payer: BLUE CROSS/BLUE SHIELD | Admitting: Emergency Medicine

## 2016-02-08 VITALS — BP 114/62 | HR 65 | Temp 98.7°F | Resp 17 | Ht 71.0 in | Wt 149.0 lb

## 2016-02-08 DIAGNOSIS — K0889 Other specified disorders of teeth and supporting structures: Secondary | ICD-10-CM | POA: Diagnosis not present

## 2016-02-08 DIAGNOSIS — K05219 Aggressive periodontitis, localized, unspecified severity: Secondary | ICD-10-CM

## 2016-02-08 DIAGNOSIS — R220 Localized swelling, mass and lump, head: Secondary | ICD-10-CM

## 2016-02-08 MED ORDER — DICLOFENAC SODIUM 75 MG PO TBEC: 75.0000 mg | DELAYED_RELEASE_TABLET | Freq: Two times a day (BID) | ORAL | 0 refills | Status: AC

## 2016-02-08 MED ORDER — AMOXICILLIN-POT CLAVULANATE 875-125 MG PO TABS: 1.0000 | ORAL_TABLET | Freq: Two times a day (BID) | ORAL | 0 refills | Status: DC

## 2016-02-08 NOTE — Progress Notes (Signed)
Kyle Allen 46 y.o.   Chief Complaint  Patient presents with  . Facial Swelling    onset 2 weeks ago    HISTORY OF PRESENT ILLNESS: This is a 46 y.o. male complaining of pain to left side of face; noticed swelling and numbness this past weekend; noticed blister inside mouth which he punctured and brownish fluid came out.  HPI   Prior to Admission medications   Medication Sig Start Date End Date Taking? Authorizing Provider  acetaminophen (TYLENOL) 500 MG tablet Take 1,000 mg by mouth every 6 (six) hours as needed (For headache.).   Yes Historical Provider, MD  Multiple Vitamin (MULTIVITAMIN WITH MINERALS) TABS tablet Take 1 tablet by mouth daily.   Yes Historical Provider, MD  mupirocin ointment (BACTROBAN) 2 % Apply 1 application topically 3 (three) times daily. 10/07/15  Yes Ethelda Chick, MD  omeprazole (PRILOSEC) 20 MG capsule TAKE 1 CAPSULE BY MOUTH TWICE DAILY BEFORE A MEAL 01/29/16  Yes Ethelda Chick, MD  sertraline (ZOLOFT) 100 MG tablet Take 2 tablets (200 mg total) by mouth daily. 11/08/15  Yes Ethelda Chick, MD  traZODone (DESYREL) 50 MG tablet Take 1 tablet (50 mg total) by mouth at bedtime as needed for sleep. 11/08/15  Yes Ethelda Chick, MD    Allergies  Allergen Reactions  . Carafate [Sucralfate] Other (See Comments)    "Made sick on stomach"    Patient Active Problem List   Diagnosis Date Noted  . Acute gastric ulcer 07/31/2015  . Anxiety and depression 09/18/2013  . Insomnia 09/18/2013  . Tobacco abuse 09/18/2013  . Substance abuse in remission 09/18/2013  . Herpes labialis 09/18/2013    Past Medical History:  Diagnosis Date  . Allergy   . Anemia   . Anxiety   . Depression   . GERD (gastroesophageal reflux disease)   . History of kidney stones    episodes x1 -multiple stones- not a bother at this time.  . Hypertension    not on meds presently by MD action.  . Substance abuse     Past Surgical History:  Procedure Laterality Date  .  COLONOSCOPY WITH PROPOFOL N/A 05/03/2015   Procedure: COLONOSCOPY WITH PROPOFOL;  Surgeon: Charolett Bumpers, MD;  Location: [facility];  Service: Endoscopy;  Laterality: N/A;  . ESOPHAGOGASTRODUODENOSCOPY (EGD) WITH PROPOFOL N/A 05/03/2015   Procedure: ESOPHAGOGASTRODUODENOSCOPY (EGD) WITH PROPOFOL;  Surgeon: Charolett Bumpers, MD;  Location: [facility];  Service: Endoscopy;  Laterality: N/A;    Social History   Social History  . Marital status: Single    Spouse name: N/A  . Number of children: N/A  . Years of education: N/A   Occupational History  . Not on file.   Social History Main Topics  . Smoking status: Current Every Day Smoker    Packs/day: 1.00    Years: 20.00    Types: Cigarettes  . Smokeless tobacco: Never Used     Comment: Opiate use   . Alcohol use No     Comment: Quit  2015 also.  . Drug use: Yes    Types: Marijuana, Other-see comments, Heroin     Comment: Quit 04-02-13 Attends AA  . Sexual activity: Not Currently   Other Topics Concern  . Not on file   Social History Narrative   Marital status: single; not dating.  From [place], [place].       Children:  None       Lives: at [facility] since 07/2013.  Group home of 8 men in recovery.      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      Tobacco:  1 ppd x 20 years.  Never quit.      Alcohol: in recovery; drinking 1/5 per day; duration 20 years; DWIs x 5; last DWI 09/2009.  Gets license back this month; four years without license.      Drugs:  Marijuana, prescription pills (pain medications Oxycontin, Vicodin, some benzos).  No heroine or cocaine.      Exercise:  Six months; joined [employer]; cardio to start; free weights    Family History  Problem Relation Age of Onset  . Hyperlipidemia Mother   . Hypertension Mother   . Hyperlipidemia Father   . Hypertension Father   . Hyperlipidemia Brother   . Hypertension Brother   . Cancer Maternal  Grandmother   . Hyperlipidemia Maternal Grandmother   . Hypertension Maternal Grandmother   . Hyperlipidemia Maternal Grandfather   . Hypertension Maternal Grandfather   . Diabetes Maternal Grandfather   . Hyperlipidemia Paternal Grandmother   . Hypertension Paternal Grandmother   . Hyperlipidemia Paternal Grandfather   . Hypertension Paternal Grandfather   . Heart disease Paternal Grandfather      Review of Systems  Constitutional: Negative for chills, fever and malaise/fatigue.  HENT: Negative for congestion, nosebleeds, sinus pain and sore throat.   Eyes: Negative for blurred vision, pain, discharge and redness.  Respiratory: Negative for cough and shortness of breath.   Cardiovascular: Negative for chest pain and palpitations.  Gastrointestinal: Negative for abdominal pain, nausea and vomiting.  Genitourinary: Negative for dysuria and hematuria.  Musculoskeletal: Negative for myalgias and neck pain.  Skin: Negative for rash.  Neurological: Positive for sensory change (left facial). Negative for dizziness and headaches.  All other systems reviewed and are negative.   Vitals:   02/08/16 1733  BP: 114/62  Pulse: 65  Resp: 17  Temp: 98.7 F (37.1 C)    Physical Exam  Constitutional: He is oriented to person, place, and time. He appears well-developed and well-nourished.  HENT:  Head: Normocephalic and atraumatic.  Mouth/Throat: Oropharynx is clear and moist.    Tender and erythematous area  Eyes: Conjunctivae and EOM are normal. Pupils are equal, round, and reactive to light.  Neck: Normal range of motion. Neck supple.  Cardiovascular: Normal rate and regular rhythm.   Pulmonary/Chest: Effort normal.  Musculoskeletal: Normal range of motion.  Lymphadenopathy:    He has cervical adenopathy (left submandibular).  Neurological: He is alert and oriented to person, place, and time.  Skin: Skin is warm and dry. Capillary refill takes less than 2 seconds.  Psychiatric:  He has a normal mood and affect. His behavior is normal.  Vitals reviewed.    ASSESSMENT & PLAN: Rolm Galarik was seen today for facial swelling.  Diagnoses and all orders for this visit:  Periodontal abscess  Left facial swelling  Toothache  Other orders -     amoxicillin-clavulanate (AUGMENTIN) 875-125 MG tablet; Take 1 tablet by mouth 2 (two) times daily. -     diclofenac (VOLTAREN) 75 MG EC tablet; Take 1 tablet (75 mg total) by mouth 2 (two) times daily.    Patient Instructions       IF you received an x-ray today, you will receive an invoice from [facility]. Please contact [facility] at [phone] with questions or concerns regarding your invoice.   IF you received labwork today,  you will receive an invoice from [facility]. Please contact [facility] at [phone] with questions or concerns regarding your invoice.   Our billing staff will not be able to assist you with questions regarding bills from these companies.  You will be contacted with the lab results as soon as they are available. The fastest way to get your results is to activate your My Chart account. Instructions are located on the last page of this paperwork. If you have not heard from [place] regarding the results in 2 weeks, please contact this office.     Dental Pain Introduction Dental pain may be caused by many things, including:  Tooth decay (cavities or caries). Cavities cause the nerve of your tooth to be open to air and hot or cold temperatures. This can cause pain or discomfort.  Abscess or infection. A dental abscess is an area that is full of infected pus from a bacterial infection in the inner part of the tooth (pulp). It usually happens at the end of the tooth's root.  Injury.  An unknown reason (idiopathic). Your pain may be mild or severe. It may only happen when:  You are chewing.  You are exposed to hot or cold temperature.  You are eating or drinking sugary foods or  beverages, such as:  Soda.  Candy. Your pain may also be there all of the time. Follow these instructions at home: Watch your dental pain for any changes. Do these things to lessen your discomfort:  Take medicines only as told by your dentist.  If your dentist tells you to take an antibiotic medicine, finish all of it even if you start to feel better.  Keep all follow-up visits as told by your dentist. This is important.  Do not apply heat to the outside of your face.  Rinse your mouth or gargle with salt water if told by your dentist. This helps with pain and swelling.  You can make salt water by adding  tsp of salt to 1 cup of warm water.  Apply ice to the painful area of your face:  Put ice in a plastic bag.  Place a towel between your skin and the bag.  Leave the ice on for 20 minutes, 2-3 times per day.  Avoid foods or drinks that cause you pain, such as:  Very hot or very cold foods or drinks.  Sweet or sugary foods or drinks. Contact a doctor if:  Your pain is not helped with medicines.  Your symptoms are worse.  You have new symptoms. Get help right away if:  You cannot open your mouth.  You are having trouble breathing or swallowing.  You have a fever.  Your face, neck, or jaw is puffy (swollen). This information is not intended to replace advice given to you by your health care provider. Make sure you discuss any questions you have with your health care provider. Document Released: 06/06/2007 Document Revised: 05/26/2015 Document Reviewed: 12/14/2013  2017 Elsevier      Edwina Barth, MD [facility]

## 2016-02-08 NOTE — Patient Instructions (Addendum)
     IF you received an x-ray today, you will receive an invoice from [facility]. Please contact [facility] at [phone] with questions or concerns regarding your invoice.   IF you received labwork today, you will receive an invoice from [facility]. Please contact [facility] at [phone] with questions or concerns regarding your invoice.   Our billing staff will not be able to assist you with questions regarding bills from these companies.  You will be contacted with the lab results as soon as they are available. The fastest way to get your results is to activate your My Chart account. Instructions are located on the last page of this paperwork. If you have not heard from us regarding the results in 2 weeks, please contact this office.     Dental Pain Introduction Dental pain may be caused by many things, including:  Tooth decay (cavities or caries). Cavities cause the nerve of your tooth to be open to air and hot or cold temperatures. This can cause pain or discomfort.  Abscess or infection. A dental abscess is an area that is full of infected pus from a bacterial infection in the inner part of the tooth (pulp). It usually happens at the end of the tooth's root.  Injury.  An unknown reason (idiopathic). Your pain may be mild or severe. It may only happen when:  You are chewing.  You are exposed to hot or cold temperature.  You are eating or drinking sugary foods or beverages, such as:  Soda.  Candy. Your pain may also be there all of the time. Follow these instructions at home: Watch your dental pain for any changes. Do these things to lessen your discomfort:  Take medicines only as told by your dentist.  If your dentist tells you to take an antibiotic medicine, finish all of it even if you start to feel better.  Keep all follow-up visits as told by your dentist. This is important.  Do not apply heat to the outside of your face.  Rinse your mouth  or gargle with salt water if told by your dentist. This helps with pain and swelling.  You can make salt water by adding  tsp of salt to 1 cup of warm water.  Apply ice to the painful area of your face:  Put ice in a plastic bag.  Place a towel between your skin and the bag.  Leave the ice on for 20 minutes, 2-3 times per day.  Avoid foods or drinks that cause you pain, such as:  Very hot or very cold foods or drinks.  Sweet or sugary foods or drinks. Contact a doctor if:  Your pain is not helped with medicines.  Your symptoms are worse.  You have new symptoms. Get help right away if:  You cannot open your mouth.  You are having trouble breathing or swallowing.  You have a fever.  Your face, neck, or jaw is puffy (swollen). This information is not intended to replace advice given to you by your health care provider. Make sure you discuss any questions you have with your health care provider. Document Released: 06/06/2007 Document Revised: 05/26/2015 Document Reviewed: 12/14/2013  2017 Elsevier  

## 2016-02-15 ENCOUNTER — Encounter: Payer: Self-pay | Admitting: Family Medicine

## 2016-02-16 ENCOUNTER — Other Ambulatory Visit: Payer: Self-pay

## 2016-02-16 MED ORDER — SERTRALINE HCL 100 MG PO TABS: 200.0000 mg | ORAL_TABLET | Freq: Every day | ORAL | 0 refills | Status: DC

## 2016-02-17 ENCOUNTER — Other Ambulatory Visit: Payer: Self-pay | Admitting: Emergency Medicine

## 2016-02-18 NOTE — Telephone Encounter (Signed)
Last saw dr Alvy Bimlersagardia 02/09/16

## 2016-02-20 MED ORDER — AMOXICILLIN-POT CLAVULANATE 875-125 MG PO TABS: 1.0000 | ORAL_TABLET | Freq: Two times a day (BID) | ORAL | 0 refills | Status: DC

## 2016-02-20 NOTE — Telephone Encounter (Signed)
OK to refill

## 2016-02-22 ENCOUNTER — Other Ambulatory Visit: Payer: Self-pay | Admitting: Family Medicine

## 2016-02-23 ENCOUNTER — Encounter: Payer: Self-pay | Admitting: Family Medicine

## 2016-02-23 DIAGNOSIS — R200 Anesthesia of skin: Secondary | ICD-10-CM

## 2016-03-05 NOTE — Telephone Encounter (Signed)
I thought his symptoms were all secondary to a periodontal infection. The numbness remains despite dental treatment so I guess it's reasonable to obtain Neuro evaluation and also consider diagnostic imaging. Thanks. [place]

## 2016-03-07 NOTE — Addendum Note (Signed)
Addended by: Ethelda ChickSMITH, Keairra Bardon M on: 03/07/2016 10:05 PM   Modules accepted: Orders

## 2016-03-13 ENCOUNTER — Ambulatory Visit (INDEPENDENT_AMBULATORY_CARE_PROVIDER_SITE_OTHER): Payer: BLUE CROSS/BLUE SHIELD | Admitting: Family Medicine

## 2016-03-13 ENCOUNTER — Encounter: Payer: Self-pay | Admitting: Family Medicine

## 2016-03-13 VITALS — BP 118/68 | HR 60 | Temp 98.7°F | Resp 18 | Ht 71.0 in | Wt 146.0 lb

## 2016-03-13 DIAGNOSIS — F419 Anxiety disorder, unspecified: Secondary | ICD-10-CM

## 2016-03-13 DIAGNOSIS — K219 Gastro-esophageal reflux disease without esophagitis: Secondary | ICD-10-CM

## 2016-03-13 DIAGNOSIS — B001 Herpesviral vesicular dermatitis: Secondary | ICD-10-CM | POA: Diagnosis not present

## 2016-03-13 DIAGNOSIS — F329 Major depressive disorder, single episode, unspecified: Secondary | ICD-10-CM

## 2016-03-13 DIAGNOSIS — Z720 Tobacco use: Secondary | ICD-10-CM

## 2016-03-13 DIAGNOSIS — R200 Anesthesia of skin: Secondary | ICD-10-CM | POA: Diagnosis not present

## 2016-03-13 DIAGNOSIS — R6884 Jaw pain: Secondary | ICD-10-CM | POA: Diagnosis not present

## 2016-03-13 DIAGNOSIS — F1911 Other psychoactive substance abuse, in remission: Secondary | ICD-10-CM

## 2016-03-13 DIAGNOSIS — F32A Depression, unspecified: Secondary | ICD-10-CM

## 2016-03-13 DIAGNOSIS — F418 Other specified anxiety disorders: Secondary | ICD-10-CM | POA: Diagnosis not present

## 2016-03-13 DIAGNOSIS — D508 Other iron deficiency anemias: Secondary | ICD-10-CM | POA: Diagnosis not present

## 2016-03-13 MED ORDER — OMEPRAZOLE 20 MG PO CPDR: DELAYED_RELEASE_CAPSULE | ORAL | 5 refills | Status: DC

## 2016-03-13 MED ORDER — SERTRALINE HCL 100 MG PO TABS: 200.0000 mg | ORAL_TABLET | Freq: Every day | ORAL | 5 refills | Status: DC

## 2016-03-13 NOTE — Progress Notes (Signed)
Subjective:    Patient ID: Kyle Allen, male    DOB: 22-Jun-1970, 46 y.o.   MRN: [id number]  03/13/2016  Jaw Pain (Numbness in chin & jaw x4weeks. Possible mouth infection ?)   HPI This 46 y.o. male presents for evaluation of ongoing jaw pain, numbness in jaw for one month.  Awoke 4.5 weeks ago, woke up with numbness in L chin area.  Worried about CVA.  Told friends to monitor pt during the day. Woke up three days later, L side of face was swollen and had a horrible tooth ache.  Has experienced an abscessed tooth in the past and certain that having recurrence.  Wanted to find a new dentist.  Had to find a new dentist.  Sent email requesting abx.  Advised to be seen.  Evaluated by Dr. Alvy Bimler; prescribed; Amoxillin.  Made appointment with new dentist who identified R sided crowns.  Dentist did not feel that numbness due to abscessed tooth.  At home, cut gumline and blood and pus drained.  Could feel pus moving around. Did not feel the cut because of numbness.  Felt that was good; started gargling with peroxide.  Also went to [employer]; natural remedies; made drop to kill infection and to put on gums.  Burned horribly and then felt great. Ran out of liquid.  Emailed PCP for neurology referral.  Has appointment on 03/24/16 with neurologist.  Dentist wants to schedule repair on R side; horrible cold and hot sensitivity.  Drooling from L side of mouth; afraid to eat on L side.  Eating a lot of ice cream. Eating soft things.  Has lost a lot of weight.  Today at work, slammed so hard on ice.  Slid on ice.  Went face forward; had hands full. Teeth clamped down.  Did not bit tongue.  R side of jaw really hurt.  Numbness is along jaw line but also inside of mouth.  Has been slowly improving.  50% improved with numbness.  When took abx, really improved.   No pain associated with numbness.  No tingling.  No burning.  No slurred speech; no difficulties swallowing.  Can pull out whisker No tongue numbness; no  difficulties swallowing.  Took three weeks of Amoxicillin.   Immunization History  Administered Date(s) Administered  . Influenza Split 10/16/2014, 10/25/2015  . Influenza,inj,Quad PF,36+ Mos 09/16/2013  . Tdap 09/16/2013   Wt Readings from Last 3 Encounters:  03/26/16 147 lb (66.7 kg)  03/13/16 146 lb (66.2 kg)  02/08/16 149 lb (67.6 kg)   BP Readings from Last 3 Encounters:  03/26/16 117/69  03/13/16 118/68  02/08/16 114/62    Review of Systems  Constitutional: Negative for activity change, appetite change, chills, diaphoresis, fatigue and fever.  HENT: Positive for dental problem, drooling and mouth sores. Negative for congestion, ear discharge, ear pain, facial swelling, hearing loss, nosebleeds, postnasal drip, rhinorrhea, sinus pain, sinus pressure, sneezing, sore throat, tinnitus, trouble swallowing and voice change.   Eyes: Negative for visual disturbance.  Respiratory: Negative for cough and shortness of breath.   Cardiovascular: Negative for chest pain, palpitations and leg swelling.  Gastrointestinal: Negative for abdominal pain, diarrhea, nausea and vomiting.  Endocrine: Negative for cold intolerance, heat intolerance, polydipsia, polyphagia and polyuria.  Skin: Negative for color change, rash and wound.  Neurological: Positive for numbness. Negative for dizziness, tremors, seizures, syncope, facial asymmetry, speech difficulty, weakness, light-headedness and headaches.  Hematological: Negative for adenopathy. Does not bruise/bleed easily.  Psychiatric/Behavioral: Negative for dysphoric  mood and sleep disturbance. The patient is not nervous/anxious.     Past Medical History:  Diagnosis Date  . Abscessed tooth   . Allergy   . Anemia   . Anxiety   . Depression   . GERD (gastroesophageal reflux disease)   . History of kidney stones    episodes x1 -multiple stones- not a bother at this time.  . Hypertension    not on meds presently by MD action.  . Substance  abuse    Past Surgical History:  Procedure Laterality Date  . COLONOSCOPY WITH PROPOFOL N/A 05/03/2015   Procedure: COLONOSCOPY WITH PROPOFOL;  Surgeon: Charolett BumpersMartin K Johnson, MD;  Location: [facility];  Service: Endoscopy;  Laterality: N/A;  . ESOPHAGOGASTRODUODENOSCOPY (EGD) WITH PROPOFOL N/A 05/03/2015   Procedure: ESOPHAGOGASTRODUODENOSCOPY (EGD) WITH PROPOFOL;  Surgeon: Charolett BumpersMartin K Johnson, MD;  Location: [facility];  Service: Endoscopy;  Laterality: N/A;   Allergies  Allergen Reactions  . Carafate [Sucralfate] Other (See Comments)    "Made sick on stomach"    Social History   Social History  . Marital status: Single    Spouse name: N/A  . Number of children: 0  . Years of education: 4 years college   Occupational History  . Maintenance crew    Social History Main Topics  . Smoking status: Current Every Day Smoker    Packs/day: 1.00    Years: 20.00    Types: Cigarettes  . Smokeless tobacco: Never Used     Comment: Opiate use   . Alcohol use No     Comment: Quit  2015 also - attends AA.  . Drug use: Yes    Types: Marijuana, Other-see comments, Heroin     Comment: Quit 04-02-13 Attends AA  . Sexual activity: Not Currently   Other Topics Concern  . Not on file   Social History Narrative   Marital status: single; not dating.  From [place], [place].       Children:  None          Employment:  Maintenance at [employer].      Tobacco:  1 ppd x 20 years.  Never quit.      Alcohol: in recovery; drinking 1/5 per day; duration 20 years; DWIs x 5; last DWI 09/2009.  Gets license back this month; four years without license.      Drugs:  Marijuana, prescription pills (pain medications Oxycontin, Vicodin, some benzos).  No heroine or cocaine.      Exercise:  Six months; joined [employer]; cardio to start; free weights   Lives at home alone.   Right-handed.   No daily caffeine use.   Family History  Problem Relation Age of Onset  . Hyperlipidemia Mother   .  Hypertension Mother   . Hyperlipidemia Father   . Hypertension Father   . Hyperlipidemia Brother   . Hypertension Brother   . Cancer Maternal Grandmother   . Hyperlipidemia Maternal Grandmother   . Hypertension Maternal Grandmother   . Hyperlipidemia Maternal Grandfather   . Hypertension Maternal Grandfather   . Diabetes Maternal Grandfather   . Hyperlipidemia Paternal Grandmother   . Hypertension Paternal Grandmother   . Hyperlipidemia Paternal Grandfather   . Hypertension Paternal Grandfather   . Heart disease Paternal Grandfather        Objective:    BP 118/68   Pulse 60   Temp 98.7 F (37.1 C) (Oral)   Resp 18   Ht 5\' 11"  (1.803 m)  Wt 146 lb (66.2 kg)   SpO2 95%   BMI 20.36 kg/m  Physical Exam  Constitutional: He is oriented to person, place, and time. He appears well-developed and well-nourished. No distress.  HENT:  Head: Normocephalic and atraumatic.    Right Ear: Tympanic membrane, external ear and ear canal normal.  Left Ear: Tympanic membrane, external ear and ear canal normal.  Nose: Nose normal. No mucosal edema or rhinorrhea.  Mouth/Throat: Oropharynx is clear and moist and mucous membranes are normal. Normal dentition. Lacerations present. No uvula swelling.  +decreased sensation to sharp and dull touch at area depicted.  Eyes: Conjunctivae and EOM are normal. Pupils are equal, round, and reactive to light.  Neck: Normal range of motion. Neck supple. Carotid bruit is not present. No thyromegaly present.  Cardiovascular: Normal rate, regular rhythm, normal heart sounds and intact distal pulses.  Exam reveals no gallop and no friction rub.   No murmur heard. Pulmonary/Chest: Effort normal and breath sounds normal. He has no wheezes. He has no rales.  Abdominal: Soft. Bowel sounds are normal. He exhibits no distension and no mass. There is no tenderness. There is no rebound and no guarding.  Lymphadenopathy:    He has no cervical adenopathy.    Neurological: He is alert and oriented to person, place, and time. No cranial nerve deficit. He exhibits normal muscle tone. Coordination normal.  Skin: Skin is warm and dry. No rash noted. He is not diaphoretic.  Psychiatric: He has a normal mood and affect. His behavior is normal. Judgment and thought content normal.  Nursing note and vitals reviewed.  Depression screen [facility] 2/9 03/13/2016 02/08/2016 11/08/2015 08/16/2015 07/06/2015  Decreased Interest 0 0 0 0 0  Down, Depressed, Hopeless 0 0 0 0 0  PHQ - 2 Score 0 0 0 0 0  Altered sleeping - - - - -  Tired, decreased energy - - - - -  Change in appetite - - - - -  Feeling bad or failure about yourself  - - - - -  Trouble concentrating - - - - -  Moving slowly or fidgety/restless - - - - -  Suicidal thoughts - - - - -  PHQ-9 Score - - - - -   Fall Risk  03/13/2016 08/16/2015 07/06/2015 03/08/2015 11/22/2014  Falls in the past year? No No No No No        Assessment & Plan:   1. Other iron deficiency anemia   2. Gastroesophageal reflux disease without esophagitis   3. Herpes labialis   4. Anxiety and depression   5. Tobacco abuse   6. Substance abuse in remission   7. Mandibular pain   8. Numbness    -persistent L jaw/mandibular numbness following L facial swelling and tooth pain.  Improvement with Amoxicillin therapy yet with persistent numbness.  Appointment in upcoming two weeks with neurology.  Highly suggestive of primary dental etiology yet concerning for persistent numbness in area.  Tobacco abuse is ongoing and history of substance abuse; also history of recurrent herpes labialis with frequent recurrence; s/p bx that was benign. Keep appointment with neurology; consider imaging of jaw/neck to rule out compression from mass/malignancy.  Pt desires to delay imaging until evaluated by neurology due to upcoming appointment. -obtain labs. -refill of Omeprazole provided. -emotionally stable; refill of Zoloft provided as well. -continue  to attend NA/AA meetings; continue with support from sponsor.  Doing well. -has lost weight with recent jaw numbness and pain.  s/p EGD  and colonoscopy in the past year with gastric ulcer.  Will need to wean Prilosec to one tablet daily at next visit. -close follow-up to check weight.   Orders Placed This Encounter  Procedures  . CBC with Differential/Platelet  . Comprehensive metabolic panel  . Iron  . Iron and TIBC   Meds ordered this encounter  Medications  . omeprazole (PRILOSEC) 20 MG capsule    Sig: TAKE 1 CAPSULE BY MOUTH TWICE DAILY BEFORE A MEAL    Dispense:  60 capsule    Refill:  5  . sertraline (ZOLOFT) 100 MG tablet    Sig: Take 2 tablets (200 mg total) by mouth daily.    Dispense:  60 tablet    Refill:  5    Return in about 2 months (around 05/13/2016) for complete physical examiniation.   Dillian Feig Paulita Fujita, M.D. Primary Care at [facility] previously [facility] [address] [place], [place]  [address] [phone] phone [phone] fax

## 2016-03-13 NOTE — Patient Instructions (Signed)
     IF you received an x-ray today, you will receive an invoice from [facility]. Please contact [facility] at [phone] with questions or concerns regarding your invoice.   IF you received labwork today, you will receive an invoice from [facility]. Please contact [facility] at [phone] with questions or concerns regarding your invoice.   Our billing staff will not be able to assist you with questions regarding bills from these companies.  You will be contacted with the lab results as soon as they are available. The fastest way to get your results is to activate your My Chart account. Instructions are located on the last page of this paperwork. If you have not heard from us regarding the results in 2 weeks, please contact this office.     

## 2016-03-14 LAB — COMPREHENSIVE METABOLIC PANEL
ALT: 11 IU/L (ref 0–44)
AST: 16 IU/L (ref 0–40)
Albumin/Globulin Ratio: 2.4 — ABNORMAL HIGH (ref 1.2–2.2)
Albumin: 4.5 g/dL (ref 3.5–5.5)
Alkaline Phosphatase: 34 IU/L — ABNORMAL LOW (ref 39–117)
BUN/Creatinine Ratio: 24 — ABNORMAL HIGH (ref 9–20)
BUN: 22 mg/dL (ref 6–24)
Bilirubin Total: <0.2 mg/dL (ref 0.0–1.2)
CO2: 21 mmol/L (ref 18–29)
Calcium: 9.1 mg/dL (ref 8.7–10.2)
Chloride: 107 mmol/L — ABNORMAL HIGH (ref 96–106)
Creatinine, Ser: 0.92 mg/dL (ref 0.76–1.27)
GFR calc Af Amer: 115 mL/min/{1.73_m2} (ref >59)
GFR calc non Af Amer: 99 mL/min/{1.73_m2} (ref >59)
Globulin, Total: 1.9 g/dL (ref 1.5–4.5)
Glucose: 94 mg/dL (ref 65–99)
Potassium: 4.6 mmol/L (ref 3.5–5.2)
Sodium: 143 mmol/L (ref 134–144)
Total Protein: 6.4 g/dL (ref 6.0–8.5)

## 2016-03-14 LAB — CBC WITH DIFFERENTIAL/PLATELET
Basophils Absolute: 0 10*3/uL (ref 0.0–0.2)
Basos: 1 %
EOS (ABSOLUTE): 0.1 10*3/uL (ref 0.0–0.4)
Eos: 3 %
Hematocrit: 33 % — ABNORMAL LOW (ref 37.5–51.0)
Hemoglobin: 10.8 g/dL — ABNORMAL LOW (ref 13.0–17.7)
Immature Grans (Abs): 0 10*3/uL (ref 0.0–0.1)
Immature Granulocytes: 0 %
Lymphocytes Absolute: 1.9 10*3/uL (ref 0.7–3.1)
Lymphs: 43 %
MCH: 30.6 pg (ref 26.6–33.0)
MCHC: 32.7 g/dL (ref 31.5–35.7)
MCV: 94 fL (ref 79–97)
Monocytes Absolute: 0.4 10*3/uL (ref 0.1–0.9)
Monocytes: 8 %
Neutrophils Absolute: 2 10*3/uL (ref 1.4–7.0)
Neutrophils: 45 %
Platelets: 241 10*3/uL (ref 150–379)
RBC: 3.53 x10E6/uL — ABNORMAL LOW (ref 4.14–5.80)
RDW: 16.2 % — ABNORMAL HIGH (ref 12.3–15.4)
WBC: 4.5 10*3/uL (ref 3.4–10.8)

## 2016-03-14 LAB — IRON AND TIBC
IRON SATURATION: 9 % — AB (ref 15–55)
Iron: 37 ug/dL — ABNORMAL LOW (ref 38–169)
TIBC: 435 ug/dL (ref 250–450)
UIBC: 398 ug/dL — AB (ref 111–343)

## 2016-03-20 ENCOUNTER — Encounter: Payer: Self-pay | Admitting: Family Medicine

## 2016-03-21 ENCOUNTER — Telehealth: Payer: Self-pay | Admitting: Family Medicine

## 2016-03-21 NOTE — Telephone Encounter (Signed)
Dr Katrinka BlazingSmith Kyle Allen is asking for a refill of PCN he states that his jaw is swollen and also he has an appointment with a Neurologist on Monday he says that he has sent an e-mail also please respond

## 2016-03-21 NOTE — Telephone Encounter (Signed)
Ordinarily we do not give antibiotics, but he was just seen 8 days ago.  I am sending this to you for review.  I can reiterate that he would need to come in...Kyle Allen

## 2016-03-22 MED ORDER — AMOXICILLIN 500 MG PO TABS: 1000.0000 mg | ORAL_TABLET | Freq: Two times a day (BID) | ORAL | 0 refills | Status: DC

## 2016-03-23 NOTE — Telephone Encounter (Signed)
Prescribed Amoxicillin; MyChart message sent 03/22/16.

## 2016-03-26 ENCOUNTER — Encounter: Payer: Self-pay | Admitting: Neurology

## 2016-03-26 ENCOUNTER — Ambulatory Visit (INDEPENDENT_AMBULATORY_CARE_PROVIDER_SITE_OTHER): Payer: BLUE CROSS/BLUE SHIELD | Admitting: Neurology

## 2016-03-26 VITALS — BP 117/69 | HR 71 | Ht 71.0 in | Wt 147.0 lb

## 2016-03-26 DIAGNOSIS — R6884 Jaw pain: Secondary | ICD-10-CM | POA: Diagnosis not present

## 2016-03-26 DIAGNOSIS — R200 Anesthesia of skin: Secondary | ICD-10-CM

## 2016-03-26 NOTE — Progress Notes (Signed)
PATIENT: Kyle Allen DOB: 11-20-1970  Chief Complaint  Patient presents with  . Facial Numbness    Reports left-sided facial numbness for the last three months.  He is on his third round of Amoxicillin.  He has a pocket of fluid inside his left jaw that he has lanced himself with a razor.  He has seen a dentist because he felt it may be an abscessed tooth.   Marland Kitchen PCP    Ethelda Chick, MD     HISTORICAL  Kyle Allen is a 46 year old right-handed male, seen in refer by his primary care doctor Ethelda Chick, for evaluation of left jaw swelling and numbness, initial evaluation was on March 26 2016.  He had a history of hypertension, depression, previous polysubstance abuse, including alcohol, cocaine, IV heroine, he eventually underwent rehabilitation program, has been substance free since 2015.  He reported previous history of left jaw and right jaw abscess in the past, in January 2018, he noticed gradually. of left jaw numbness, gradually getting worse. The point of not being able to feel pain when he developed his left lower whisker. Couple days later, he noticed swelling of left jaw, very similar to his previous left jaw abscess. He was given amoxicillin by his primary care physician, reported dramatic improvement next morning. However, when he was evaluated by his dentist few days later, x-ray showed no significant abnormality, he was told it was not a dental problem.  He had recurrent left jaw swelling, pain, numbness, despite significant improvement with amoxicillin treatment. He noticed a small abscess at the left lower molar, he self lanced it, with significant improvement.  He was later treated with 3 more rounds of antibiotics, with significant improvement of each treatment. He now has decreased and numbness at the left V3 territory, but still has decreases sensitivity, left cervical region pain upon deep palpitation   I reviewed laboratory evaluation in March 2018: Decreased  ironwhen he plaqued UIBC 398, normal WBC 4.5, mild anemia 10 point 8, RDW 16 point 2, neutrophil 45%, normal CMP, creatinine 0.92,  REVIEW OF SYSTEMS: Full 14 system review of systems performed and notable only for as above  ALLERGIES: Allergies  Allergen Reactions  . Carafate [Sucralfate] Other (See Comments)    "Made sick on stomach"    HOME MEDICATIONS: Current Outpatient Prescriptions  Medication Sig Dispense Refill  . acetaminophen (TYLENOL) 500 MG tablet Take 1,000 mg by mouth every 6 (six) hours as needed (For headache.).    Marland Kitchen amoxicillin (AMOXIL) 500 MG tablet Take 2 tablets (1,000 mg total) by mouth 2 (two) times daily. 40 tablet 0  . Multiple Vitamin (MULTIVITAMIN WITH MINERALS) TABS tablet Take 1 tablet by mouth daily.    . mupirocin ointment (BACTROBAN) 2 % Apply 1 application topically 3 (three) times daily. 30 g 3  . omeprazole (PRILOSEC) 20 MG capsule TAKE 1 CAPSULE BY MOUTH TWICE DAILY BEFORE A MEAL 60 capsule 5  . sertraline (ZOLOFT) 100 MG tablet Take 2 tablets (200 mg total) by mouth daily. 60 tablet 5  . traZODone (DESYREL) 50 MG tablet Take 1 tablet (50 mg total) by mouth at bedtime as needed for sleep. 30 tablet 5   No current facility-administered medications for this visit.     PAST MEDICAL HISTORY: Past Medical History:  Diagnosis Date  . Abscessed tooth   . Allergy   . Anemia   . Anxiety   . Depression   . GERD (gastroesophageal reflux disease)   .  History of kidney stones    episodes x1 -multiple stones- not a bother at this time.  . Hypertension    not on meds presently by MD action.  . Substance abuse     PAST SURGICAL HISTORY: Past Surgical History:  Procedure Laterality Date  . COLONOSCOPY WITH PROPOFOL N/A 05/03/2015   Procedure: COLONOSCOPY WITH PROPOFOL;  Surgeon: Charolett Bumpers, MD;  Location: [facility];  Service: Endoscopy;  Laterality: N/A;  . ESOPHAGOGASTRODUODENOSCOPY (EGD) WITH PROPOFOL N/A 05/03/2015   Procedure:  ESOPHAGOGASTRODUODENOSCOPY (EGD) WITH PROPOFOL;  Surgeon: Charolett Bumpers, MD;  Location: [facility];  Service: Endoscopy;  Laterality: N/A;    FAMILY HISTORY: Family History  Problem Relation Age of Onset  . Hyperlipidemia Mother   . Hypertension Mother   . Hyperlipidemia Father   . Hypertension Father   . Hyperlipidemia Brother   . Hypertension Brother   . Cancer Maternal Grandmother   . Hyperlipidemia Maternal Grandmother   . Hypertension Maternal Grandmother   . Hyperlipidemia Maternal Grandfather   . Hypertension Maternal Grandfather   . Diabetes Maternal Grandfather   . Hyperlipidemia Paternal Grandmother   . Hypertension Paternal Grandmother   . Hyperlipidemia Paternal Grandfather   . Hypertension Paternal Grandfather   . Heart disease Paternal Grandfather     SOCIAL HISTORY:  Social History   Social History  . Marital status: Single    Spouse name: N/A  . Number of children: 0  . Years of education: 4 years college   Occupational History  . Maintenance crew    Social History Main Topics  . Smoking status: Current Every Day Smoker    Packs/day: 1.00    Years: 20.00    Types: Cigarettes  . Smokeless tobacco: Never Used     Comment: Opiate use   . Alcohol use No     Comment: Quit  2015 also - attends AA.  . Drug use: Yes    Types: Marijuana, Other-see comments, Heroin     Comment: Quit 04-02-13 Attends AA  . Sexual activity: Not Currently   Other Topics Concern  . Not on file   Social History Narrative   Marital status: single; not dating.  From [place], [place].       Children:  None          Employment:  Maintenance at [employer].      Tobacco:  1 ppd x 20 years.  Never quit.      Alcohol: in recovery; drinking 1/5 per day; duration 20 years; DWIs x 5; last DWI 09/2009.  Gets license back this month; four years without license.      Drugs:  Marijuana, prescription pills (pain medications Oxycontin, Vicodin, some benzos).  No  heroine or cocaine.      Exercise:  Six months; joined [employer]; cardio to start; free weights   Lives at home alone.   Right-handed.   No daily caffeine use.     PHYSICAL EXAM   Vitals:   03/26/16 1326  BP: 117/69  Pulse: 71  Weight: 147 lb (66.7 kg)  Height: 5\' 11"  (1.803 m)    Not recorded      Body mass index is 20.5 kg/m.  PHYSICAL EXAMNIATION:  Gen: NAD, conversant, well nourised, obese, well groomed                     Cardiovascular: Regular rate rhythm, no peripheral edema, warm, nontender. Eyes: Conjunctivae clear without exudates or hemorrhage  Neck: Supple, no carotid bruits. Pulmonary: Clear to auscultation bilaterally   NEUROLOGICAL EXAM:  MENTAL STATUS: Speech:    Speech is normal; fluent and spontaneous with normal comprehension.  Cognition:     Orientation to time, place and person     Normal recent and remote memory     Normal Attention span and concentration     Normal Language, naming, repeating,spontaneous speech     Fund of knowledge   CRANIAL NERVES: CN II: Visual fields are full to confrontation. Fundoscopic exam is normal with sharp discs and no vascular changes. Pupils are round equal and briskly reactive to light. CN III, IV, VI: extraocular movement are normal. No ptosis. CN V: Facial sensation is intact to pinprick in all 3 divisions bilaterally. Corneal responses are intact.  CN VII: Face is symmetric with normal eye closure and smile. CN VIII: Hearing is normal to rubbing fingers CN IX, X: Palate elevates symmetrically. Phonation is normal. Left lower molar tenderness upon deep palpitation, there was no discrete mass palpated. CN XI: Head turning and shoulder shrug are intact CN XII: Tongue is midline with normal movements and no atrophy.  MOTOR: There is no pronator drift of out-stretched arms. Muscle bulk and tone are normal. Muscle strength is normal.  REFLEXES: Reflexes are 2+ and symmetric at the biceps, triceps,  knees, and ankles. Plantar responses are flexor.  SENSORY: Intact to light touch, pinprick, positional sensation and vibratory sensation are intact in fingers and toes.  COORDINATION: Rapid alternating movements and fine finger movements are intact. There is no dysmetria on finger-to-nose and heel-knee-shin.    GAIT/STANCE: Posture is normal. Gait is steady with normal steps, base, arm swing, and turning. Heel and toe walking are normal. Tandem gait is normal.  Romberg is absent.   DIAGNOSTIC DATA (LABS, IMAGING, TESTING) - I reviewed patient records, labs, notes, testing and imaging myself where available.   ASSESSMENT AND PLAN  Loyce Dysrik Wimbush is a 46 y.o. male    Left jaw pain, decreased sensation at left V3 territory,  Differentiation diagnosis includes left mandibular abscess, versus other space-occupying lesion, with potential impingement of left inferior alveolar nerve  Proceed with MRI of left jaw with without contrast    Levert FeinsteinYijun Hazley Dezeeuw, M.D. Ph.D.  [facility] [address], [address] [place], [place] [address] Ph: [phone] Fax: [phone]  CC: Ethelda ChickKristi M Smith, MD

## 2016-03-28 ENCOUNTER — Encounter: Payer: Self-pay | Admitting: Family Medicine

## 2016-04-02 ENCOUNTER — Other Ambulatory Visit: Payer: Self-pay | Admitting: Family Medicine

## 2016-04-03 NOTE — Telephone Encounter (Signed)
11/2015 last refill 3/18 last ov

## 2016-04-04 ENCOUNTER — Encounter: Payer: Self-pay | Admitting: Family Medicine

## 2016-04-04 ENCOUNTER — Other Ambulatory Visit: Payer: Self-pay | Admitting: Family Medicine

## 2016-04-05 ENCOUNTER — Other Ambulatory Visit: Payer: Self-pay

## 2016-04-05 MED ORDER — AMOXICILLIN 500 MG PO TABS: 1000.0000 mg | ORAL_TABLET | Freq: Two times a day (BID) | ORAL | 0 refills | Status: DC

## 2016-04-05 MED ORDER — CETIRIZINE HCL 10 MG PO TABS: 10.0000 mg | ORAL_TABLET | Freq: Every day | ORAL | 11 refills | Status: DC

## 2016-04-05 NOTE — Telephone Encounter (Signed)
My Chart request for Zyrtec

## 2016-06-11 ENCOUNTER — Encounter: Payer: Self-pay | Admitting: Family Medicine

## 2016-07-13 ENCOUNTER — Encounter: Payer: Self-pay | Admitting: Family Medicine

## 2016-07-16 MED ORDER — OMEPRAZOLE 20 MG PO CPDR: DELAYED_RELEASE_CAPSULE | ORAL | 2 refills | Status: DC

## 2016-08-13 ENCOUNTER — Other Ambulatory Visit: Payer: Self-pay | Admitting: Family Medicine

## 2016-08-17 NOTE — Telephone Encounter (Signed)
mychart message sent to pt about making an apt for more refills °

## 2016-08-17 NOTE — Telephone Encounter (Signed)
Refill req denied for zoloft 100 mg Pt needs ov.  Will have schedulers call pt to setup appt.

## 2016-10-24 ENCOUNTER — Encounter: Payer: BLUE CROSS/BLUE SHIELD | Admitting: Family Medicine

## 2016-10-27 ENCOUNTER — Encounter: Payer: Self-pay | Admitting: Family Medicine

## 2016-10-27 ENCOUNTER — Other Ambulatory Visit: Payer: Self-pay

## 2016-10-27 MED ORDER — OMEPRAZOLE 20 MG PO CPDR: DELAYED_RELEASE_CAPSULE | ORAL | 0 refills | Status: DC

## 2016-10-28 ENCOUNTER — Encounter: Payer: Self-pay | Admitting: Family Medicine

## 2016-10-29 ENCOUNTER — Other Ambulatory Visit: Payer: Self-pay | Admitting: Family Medicine

## 2016-11-01 DIAGNOSIS — Z23 Encounter for immunization: Secondary | ICD-10-CM | POA: Diagnosis not present

## 2016-11-03 ENCOUNTER — Encounter: Payer: BLUE CROSS/BLUE SHIELD | Admitting: Family Medicine

## 2016-11-03 ENCOUNTER — Other Ambulatory Visit: Payer: Self-pay | Admitting: Family Medicine

## 2016-11-06 ENCOUNTER — Encounter: Payer: Self-pay | Admitting: Family Medicine

## 2016-11-15 ENCOUNTER — Other Ambulatory Visit: Payer: Self-pay | Admitting: Family Medicine

## 2016-11-15 ENCOUNTER — Encounter: Payer: Self-pay | Admitting: Family Medicine

## 2016-11-16 MED ORDER — RANITIDINE HCL 150 MG PO TABS: 150.0000 mg | ORAL_TABLET | Freq: Every day | ORAL | 0 refills | Status: DC

## 2016-11-16 MED ORDER — PANTOPRAZOLE SODIUM 40 MG PO TBEC: 40.0000 mg | DELAYED_RELEASE_TABLET | Freq: Every day | ORAL | 0 refills | Status: DC

## 2016-11-19 MED ORDER — OMEPRAZOLE 20 MG PO CPDR: DELAYED_RELEASE_CAPSULE | ORAL | 0 refills | Status: DC

## 2016-11-26 ENCOUNTER — Other Ambulatory Visit: Payer: Self-pay | Admitting: Family Medicine

## 2016-12-05 ENCOUNTER — Encounter: Payer: Self-pay | Admitting: Family Medicine

## 2016-12-05 ENCOUNTER — Other Ambulatory Visit: Payer: Self-pay

## 2016-12-05 ENCOUNTER — Ambulatory Visit: Payer: BLUE CROSS/BLUE SHIELD | Admitting: Family Medicine

## 2016-12-05 VITALS — BP 122/70 | HR 95 | Temp 99.8°F | Resp 16 | Ht 71.46 in | Wt 150.0 lb

## 2016-12-05 DIAGNOSIS — F419 Anxiety disorder, unspecified: Secondary | ICD-10-CM | POA: Diagnosis not present

## 2016-12-05 DIAGNOSIS — F32A Depression, unspecified: Secondary | ICD-10-CM

## 2016-12-05 DIAGNOSIS — R6889 Other general symptoms and signs: Secondary | ICD-10-CM

## 2016-12-05 DIAGNOSIS — F329 Major depressive disorder, single episode, unspecified: Secondary | ICD-10-CM | POA: Diagnosis not present

## 2016-12-05 DIAGNOSIS — J101 Influenza due to other identified influenza virus with other respiratory manifestations: Secondary | ICD-10-CM | POA: Diagnosis not present

## 2016-12-05 LAB — POCT INFLUENZA A/B
INFLUENZA B, POC: NEGATIVE
Influenza A, POC: POSITIVE — AB

## 2016-12-05 LAB — POCT RAPID STREP A (OFFICE): RAPID STREP A SCREEN: NEGATIVE

## 2016-12-05 MED ORDER — IPRATROPIUM BROMIDE 0.03 % NA SOLN: 2.0000 | Freq: Two times a day (BID) | NASAL | 0 refills | Status: DC

## 2016-12-05 MED ORDER — BENZONATATE 100 MG PO CAPS: 100.0000 mg | ORAL_CAPSULE | Freq: Three times a day (TID) | ORAL | 0 refills | Status: DC | PRN

## 2016-12-05 MED ORDER — OSELTAMIVIR PHOSPHATE 75 MG PO CAPS: 75.0000 mg | ORAL_CAPSULE | Freq: Two times a day (BID) | ORAL | 0 refills | Status: DC

## 2016-12-05 NOTE — Progress Notes (Signed)
Subjective:    Patient ID: Kyle Allen, male    DOB: 06/12/1970, 46 y.o.   MRN: [id number]  12/05/2016  Sore Throat (with nasal congestion and cough) and Fatigue (with bodyaches and fever )    HPI This 46 y.o. male presents for evaluation of acute illness.  Onset four days ago.  Slept all day on the day of onset.  +fever; +malaise.  Taking OTC medication. +fever unknown.  +Tylenol and Theraflu.  +chills/sweats. +HA. +ST; dry cough really bad. Sore throat diffuse; pain with swallowing; no pain with talking.  +rhinorrhea; +nasal congestion.  No SOB.  Achy.  No nausea or vomiting or diarrhea.  Emotional support dogs.    BP Readings from Last 3 Encounters:  12/05/16 122/70  03/26/16 117/69  03/13/16 118/68   Wt Readings from Last 3 Encounters:  12/05/16 150 lb (68 kg)  03/26/16 147 lb (66.7 kg)  03/13/16 146 lb (66.2 kg)   Immunization History  Administered Date(s) Administered  . Influenza Split 10/16/2014, 10/25/2015  . Influenza,inj,Quad PF,6+ Mos 09/16/2013  . Influenza-Unspecified 11/02/2016  . Tdap 09/16/2013    Review of Systems  Constitutional: Positive for chills, diaphoresis, fatigue and fever. Negative for activity change and appetite change.  HENT: Positive for congestion, postnasal drip, rhinorrhea and sore throat. Negative for ear pain.   Respiratory: Positive for cough. Negative for shortness of breath and wheezing.   Cardiovascular: Negative for chest pain, palpitations and leg swelling.  Gastrointestinal: Negative for abdominal pain, diarrhea, nausea and vomiting.  Endocrine: Negative for cold intolerance, heat intolerance, polydipsia, polyphagia and polyuria.  Skin: Negative for color change, rash and wound.  Neurological: Positive for headaches. Negative for dizziness, tremors, seizures, syncope, facial asymmetry, speech difficulty, weakness, light-headedness and numbness.  Psychiatric/Behavioral: Negative for dysphoric mood and sleep disturbance. The  patient is not nervous/anxious.     Past Medical History:  Diagnosis Date  . Allergy   . Anemia   . Anxiety   . Depression   . GERD (gastroesophageal reflux disease)   . History of kidney stones    episodes x1 -multiple stones- not a bother at this time.  . Hypertension    not on meds presently by MD action.  . Substance abuse [facility](HCC)    Past Surgical History:  Procedure Laterality Date  . COLONOSCOPY WITH PROPOFOL N/A 05/03/2015   Procedure: COLONOSCOPY WITH PROPOFOL;  Surgeon: Charolett BumpersMartin K Johnson, MD;  Location: [facility];  Service: Endoscopy;  Laterality: N/A;  . ESOPHAGOGASTRODUODENOSCOPY (EGD) WITH PROPOFOL N/A 05/03/2015   Procedure: ESOPHAGOGASTRODUODENOSCOPY (EGD) WITH PROPOFOL;  Surgeon: Charolett BumpersMartin K Johnson, MD;  Location: [facility];  Service: Endoscopy;  Laterality: N/A;   Allergies  Allergen Reactions  . Carafate [Sucralfate] Other (See Comments)    "Made sick on stomach"   Current Outpatient Medications on File Prior to Visit  Medication Sig Dispense Refill  . acetaminophen (TYLENOL) 500 MG tablet Take 1,000 mg by mouth every 6 (six) hours as needed (For headache.).    Marland Kitchen. cetirizine (ZYRTEC) 10 MG tablet Take 1 tablet (10 mg total) by mouth daily. 30 tablet 11  . Multiple Vitamin (MULTIVITAMIN WITH MINERALS) TABS tablet Take 1 tablet by mouth daily.    Marland Kitchen. omeprazole (PRILOSEC) 20 MG capsule TAKE 1 CAPSULE BY MOUTH TWICE DAILY BEFORE A MEAL 30 capsule 0  . pantoprazole (PROTONIX) 40 MG tablet Take 1 tablet (40 mg total) daily by mouth. 30 tablet 0  . ranitidine (ZANTAC) 150 MG tablet Take 1 tablet (150 mg total)  at bedtime by mouth. 30 tablet 0  . sertraline (ZOLOFT) 100 MG tablet Take 2 tablets (200 mg total) by mouth daily. 60 tablet 5  . traZODone (DESYREL) 50 MG tablet TAKE 1 TABLET(50 MG) BY MOUTH AT BEDTIME AS NEEDED FOR SLEEP 30 tablet 0   No current facility-administered medications on file prior to visit.    Social History   Socioeconomic History  . Marital  status: Single    Spouse name: Not on file  . Number of children: 0  . Years of education: 4 years college  . Highest education level: Not on file  Social Needs  . Financial resource strain: Not on file  . Food insecurity - worry: Not on file  . Food insecurity - inability: Not on file  . Transportation needs - medical: Not on file  . Transportation needs - non-medical: Not on file  Occupational History  . Occupation: Maintenance crew  Tobacco Use  . Smoking status: Current Every Day Smoker    Packs/day: 1.00    Years: 20.00    Pack years: 20.00    Types: Cigarettes  . Smokeless tobacco: Never Used  . Tobacco comment: Opiate use   Substance and Sexual Activity  . Alcohol use: No    Alcohol/week: 0.0 oz    Comment: Quit  2015 also - attends AA.  . Drug use: Yes    Types: Marijuana, Other-see comments, Heroin    Comment: Quit 04-02-13 Attends AA  . Sexual activity: Not Currently  Other Topics Concern  . Not on file  Social History Narrative   Marital status: single; not dating.  From [place], [place].       Children:  None          Employment:  Maintenance at [employer].      Tobacco:  1 ppd x 20 years.  Never quit.      Alcohol: in recovery; drinking 1/5 per day; duration 20 years; DWIs x 5; last DWI 09/2009.  Gets license back this month; four years without license.      Drugs:  Marijuana, prescription pills (pain medications Oxycontin, Vicodin, some benzos).  No heroine or cocaine.      Exercise:  Six months; joined [employer]; cardio to start; free weights   Lives at home alone.   Right-handed.   No daily caffeine use.   Family History  Problem Relation Age of Onset  . Hyperlipidemia Mother   . Hypertension Mother   . Hyperlipidemia Father   . Hypertension Father   . Hyperlipidemia Brother   . Hypertension Brother   . Cancer Maternal Grandmother   . Hyperlipidemia Maternal Grandmother   . Hypertension Maternal Grandmother   . Hyperlipidemia  Maternal Grandfather   . Hypertension Maternal Grandfather   . Diabetes Maternal Grandfather   . Hyperlipidemia Paternal Grandmother   . Hypertension Paternal Grandmother   . Hyperlipidemia Paternal Grandfather   . Hypertension Paternal Grandfather   . Heart disease Paternal Grandfather        Objective:    BP 122/70   Pulse 95   Temp 99.8 F (37.7 C) (Oral)   Resp 16   Ht 5' 11.46" (1.815 m)   Wt 150 lb (68 kg)   SpO2 95%   BMI 20.65 kg/m  Physical Exam  Constitutional: He is oriented to person, place, and time. He appears well-developed and well-nourished. No distress.  HENT:  Head: Normocephalic and atraumatic.  Right Ear: External ear normal.  Left Ear: External ear normal.  Nose: Nose normal.  Mouth/Throat: Oropharynx is clear and moist.  Eyes: Conjunctivae and EOM are normal. Pupils are equal, round, and reactive to light.  Neck: Normal range of motion. Neck supple. Carotid bruit is not present. No thyromegaly present.  Cardiovascular: Normal rate, regular rhythm, normal heart sounds and intact distal pulses. Exam reveals no gallop and no friction rub.  No murmur heard. Pulmonary/Chest: Effort normal and breath sounds normal. He has no wheezes. He has no rales.  Abdominal: Soft. Bowel sounds are normal. He exhibits no distension and no mass. There is no tenderness. There is no rebound and no guarding.  Lymphadenopathy:    He has no cervical adenopathy.  Neurological: He is alert and oriented to person, place, and time. No cranial nerve deficit.  Skin: Skin is warm and dry. No rash noted. He is not diaphoretic.  Psychiatric: He has a normal mood and affect. His behavior is normal.  Nursing note and vitals reviewed.  No results found. [facility] 12/05/2016 03/13/2016 02/08/2016 11/08/2015 08/16/2015  Decreased Interest 0 0 0 0 0  Down, Depressed, Hopeless 0 0 0 0 0  PHQ - 2 Score 0 0 0 0 0  Altered sleeping - - - - -  Tired, decreased energy - - - - -    Change in appetite - - - - -  Feeling bad or failure about yourself  - - - - -  Trouble concentrating - - - - -  Moving slowly or fidgety/restless - - - - -  Suicidal thoughts - - - - -  PHQ-9 Score - - - - -   Fall Risk  12/05/2016 03/13/2016 08/16/2015 07/06/2015 03/08/2015  Falls in the past year? No No No No No    Results for orders placed or performed in visit on 12/05/16  POCT rapid strep A  Result Value Ref Range   Rapid Strep A Screen Negative Negative  POCT Influenza A/B  Result Value Ref Range   Influenza A, POC Positive (A) Negative   Influenza B, POC Negative Negative       Assessment & Plan:   1. Influenza A   2. Flu-like symptoms   3. Anxiety and depression    New onset; rx for Tamiflu, Tessalon Perles, Atrovent nasal spray. RTC for acute worsening. Patient considering emotional support animal; I recommend getting a dog who is crate trained and can stay at home during work hours.  Avoid crating at night for companionship.  Orders Placed This Encounter  Procedures  . POCT rapid strep A  . POCT Influenza A/B   Meds ordered this encounter  Medications  . oseltamivir (TAMIFLU) 75 MG capsule    Sig: Take 1 capsule (75 mg total) by mouth 2 (two) times daily.    Dispense:  10 capsule    Refill:  0  . ipratropium (ATROVENT) 0.03 % nasal spray    Sig: Place 2 sprays into the nose 2 (two) times daily.    Dispense:  30 mL    Refill:  0  . benzonatate (TESSALON) 100 MG capsule    Sig: Take 1-2 capsules (100-200 mg total) by mouth 3 (three) times daily as needed for cough.    Dispense:  40 capsule    Refill:  0    No Follow-up on file.   Kyle Allen Paulita FujitaMartin Marthena Whitmyer, M.D. [facility]  [facility] [address] [place],  [place]  [address] [phone] phone [phone] fax

## 2016-12-05 NOTE — Patient Instructions (Addendum)
   IF you received an x-ray today, you will receive an invoice from [facility]. Please contact [facility] at [phone] with questions or concerns regarding your invoice.   IF you received labwork today, you will receive an invoice from [facility]. Please contact [facility] at [phone] with questions or concerns regarding your invoice.   Our billing staff will not be able to assist you with questions regarding bills from these companies.  You will be contacted with the lab results as soon as they are available. The fastest way to get your results is to activate your My Chart account. Instructions are located on the last page of this paperwork. If you have not heard from us regarding the results in 2 weeks, please contact this office.      Influenza, Adult Influenza, more commonly known as "the flu," is a viral infection that primarily affects the respiratory tract. The respiratory tract includes organs that help you breathe, such as the lungs, nose, and throat. The flu causes many common cold symptoms, as well as a high fever and body aches. The flu spreads easily from person to person (is contagious). Getting a flu shot (influenza vaccination) every year is the best way to prevent influenza. What are the causes? Influenza is caused by a virus. You can catch the virus by:  Breathing in droplets from an infected person's cough or sneeze.  Touching something that was recently contaminated with the virus and then touching your mouth, nose, or eyes.  What increases the risk? The following factors may make you more likely to get the flu:  Not cleaning your hands frequently with soap and water or alcohol-based hand sanitizer.  Having close contact with many people during cold and flu season.  Touching your mouth, eyes, or nose without washing or sanitizing your hands first.  Not drinking enough fluids or not eating a healthy diet.  Not getting enough sleep or  exercise.  Being under a high amount of stress.  Not getting a yearly (annual) flu shot.  You may be at a higher risk of complications from the flu, such as a severe lung infection (pneumonia), if you:  Are over the age of 65.  Are pregnant.  Have a weakened disease-fighting system (immune system). You may have a weakened immune system if you: ? Have HIV or AIDS. ? Are undergoing chemotherapy. ? Aretaking medicines that reduce the activity of (suppress) the immune system.  Have a long-term (chronic) illness, such as heart disease, kidney disease, diabetes, or lung disease.  Have a liver disorder.  Are obese.  Have anemia.  What are the signs or symptoms? Symptoms of this condition typically last 4-10 days and may include:  Fever.  Chills.  Headache, body aches, or muscle aches.  Sore throat.  Cough.  Runny or congested nose.  Chest discomfort and cough.  Poor appetite.  Weakness or tiredness (fatigue).  Dizziness.  Nausea or vomiting.  How is this diagnosed? This condition may be diagnosed based on your medical history and a physical exam. Your health care provider may do a nose or throat swab test to confirm the diagnosis. How is this treated? If influenza is detected early, you can be treated with antiviral medicine that can reduce the length of your illness and the severity of your symptoms. This medicine may be given by mouth (orally) or through an IV tube that is inserted in one of your veins. The goal of treatment is to relieve symptoms by taking   care of yourself at home. This may include taking over-the-counter medicines, drinking plenty of fluids, and adding humidity to the air in your home. In some cases, influenza goes away on its own. Severe influenza or complications from influenza may be treated in a hospital. Follow these instructions at home:  Take over-the-counter and prescription medicines only as told by your health care provider.  Use a  cool mist humidifier to add humidity to the air in your home. This can make breathing easier.  Rest as needed.  Drink enough fluid to keep your urine clear or pale yellow.  Cover your mouth and nose when you cough or sneeze.  Wash your hands with soap and water often, especially after you cough or sneeze. If soap and water are not available, use hand sanitizer.  Stay home from work or school as told by your health care provider. Unless you are visiting your health care provider, try to avoid leaving home until your fever has been gone for 24 hours without the use of medicine.  Keep all follow-up visits as told by your health care provider. This is important. How is this prevented?  Getting an annual flu shot is the best way to avoid getting the flu. You may get the flu shot in late summer, fall, or winter. Ask your health care provider when you should get your flu shot.  Wash your hands often or use hand sanitizer often.  Avoid contact with people who are sick during cold and flu season.  Eat a healthy diet, drink plenty of fluids, get enough sleep, and exercise regularly. Contact a health care provider if:  You develop new symptoms.  You have: ? Chest pain. ? Diarrhea. ? A fever.  Your cough gets worse.  You produce more mucus.  You feel nauseous or you vomit. Get help right away if:  You develop shortness of breath or difficulty breathing.  Your skin or nails turn a bluish color.  You have severe pain or stiffness in your neck.  You develop a sudden headache or sudden pain in your face or ear.  You cannot stop vomiting. This information is not intended to replace advice given to you by your health care provider. Make sure you discuss any questions you have with your health care provider. Document Released: 12/16/1999 Document Revised: 05/26/2015 Document Reviewed: 10/12/2014 Elsevier Interactive Patient Education  2017 [employer]  

## 2016-12-08 ENCOUNTER — Encounter: Payer: Self-pay | Admitting: Family Medicine

## 2016-12-09 ENCOUNTER — Encounter: Payer: Self-pay | Admitting: Family Medicine

## 2016-12-10 ENCOUNTER — Encounter: Payer: Self-pay | Admitting: Family Medicine

## 2016-12-12 ENCOUNTER — Other Ambulatory Visit: Payer: Self-pay | Admitting: Family Medicine

## 2016-12-19 ENCOUNTER — Other Ambulatory Visit: Payer: Self-pay

## 2016-12-19 ENCOUNTER — Ambulatory Visit (INDEPENDENT_AMBULATORY_CARE_PROVIDER_SITE_OTHER): Payer: BLUE CROSS/BLUE SHIELD | Admitting: Family Medicine

## 2016-12-19 ENCOUNTER — Encounter: Payer: Self-pay | Admitting: Family Medicine

## 2016-12-19 VITALS — BP 120/62 | HR 98 | Temp 99.3°F | Resp 16 | Ht 72.05 in | Wt 150.0 lb

## 2016-12-19 DIAGNOSIS — F1911 Other psychoactive substance abuse, in remission: Secondary | ICD-10-CM | POA: Diagnosis not present

## 2016-12-19 DIAGNOSIS — Z13220 Encounter for screening for lipoid disorders: Secondary | ICD-10-CM

## 2016-12-19 DIAGNOSIS — Z23 Encounter for immunization: Secondary | ICD-10-CM | POA: Diagnosis not present

## 2016-12-19 DIAGNOSIS — Z720 Tobacco use: Secondary | ICD-10-CM | POA: Diagnosis not present

## 2016-12-19 DIAGNOSIS — B001 Herpesviral vesicular dermatitis: Secondary | ICD-10-CM | POA: Diagnosis not present

## 2016-12-19 DIAGNOSIS — Z131 Encounter for screening for diabetes mellitus: Secondary | ICD-10-CM

## 2016-12-19 DIAGNOSIS — F419 Anxiety disorder, unspecified: Secondary | ICD-10-CM

## 2016-12-19 DIAGNOSIS — F5104 Psychophysiologic insomnia: Secondary | ICD-10-CM

## 2016-12-19 DIAGNOSIS — Z125 Encounter for screening for malignant neoplasm of prostate: Secondary | ICD-10-CM | POA: Diagnosis not present

## 2016-12-19 DIAGNOSIS — F329 Major depressive disorder, single episode, unspecified: Secondary | ICD-10-CM

## 2016-12-19 DIAGNOSIS — F32A Depression, unspecified: Secondary | ICD-10-CM

## 2016-12-19 DIAGNOSIS — Z0000 Encounter for general adult medical examination without abnormal findings: Secondary | ICD-10-CM

## 2016-12-19 LAB — POCT URINALYSIS DIP (MANUAL ENTRY)
BILIRUBIN UA: NEGATIVE
GLUCOSE UA: NEGATIVE mg/dL
Leukocytes, UA: NEGATIVE
Nitrite, UA: NEGATIVE
Protein Ur, POC: 30 mg/dL — AB
SPEC GRAV UA: 1.025 (ref 1.010–1.025)
UROBILINOGEN UA: 0.2 U/dL
pH, UA: 5.5 (ref 5.0–8.0)

## 2016-12-19 MED ORDER — TRAZODONE HCL 50 MG PO TABS: ORAL_TABLET | ORAL | 0 refills | Status: DC

## 2016-12-19 MED ORDER — SERTRALINE HCL 100 MG PO TABS: 100.0000 mg | ORAL_TABLET | Freq: Every day | ORAL | 1 refills | Status: DC

## 2016-12-19 MED ORDER — FLUOXETINE HCL 20 MG PO TABS: 20.0000 mg | ORAL_TABLET | Freq: Every day | ORAL | 3 refills | Status: DC

## 2016-12-19 MED ORDER — SERTRALINE HCL 100 MG PO TABS: 200.0000 mg | ORAL_TABLET | Freq: Every day | ORAL | 1 refills | Status: DC

## 2016-12-19 MED ORDER — PANTOPRAZOLE SODIUM 40 MG PO TBEC: DELAYED_RELEASE_TABLET | ORAL | 3 refills | Status: DC

## 2016-12-19 NOTE — Progress Notes (Signed)
Subjective:    Patient ID: Kyle Allen, male    DOB: [birth year], 46 y.o.   MRN: [id number]  12/19/2016  Annual Exam    HPI This 46 y.o. male presents for Complete Physical Examination.  Last physical:  Few years ago.  Colonoscopy:  2017 Eye exam: Readers; never.  Dental exam: 2018  BP Readings from Last 3 Encounters:  12/19/16 120/62  12/05/16 122/70  03/26/16 117/69   Wt Readings from Last 3 Encounters:  12/19/16 150 lb (68 kg)  12/05/16 150 lb (68 kg)  03/26/16 147 lb (66.7 kg)   Immunization History  Administered Date(s) Administered  . Influenza Split 10/16/2014, 10/25/2015  . Influenza,inj,Quad PF,6+ Mos 09/16/2013  . Influenza-Unspecified 11/02/2016  . Tdap 09/16/2013   Polysubstance abuse in remission:  Breathalizer removed from car finally.  Was expensive.  Served time.  Could only drive patient's car.  Ordered new license without Theatre manager.  Anxiety: worsening; staying at home; does not want to go out alone.  Does not want to go to a bar.  Does not want to go to a bar. Meetings are having a negative impact.  Works five days per week; works and dark.  Tried joining [employer] groups; tried social groups.  Made it to parking lot.   Lexapro did not work. Prozac effective initially.   Review of Systems  Constitutional: Positive for appetite change and unexpected weight change. Negative for activity change, chills, diaphoresis, fatigue and fever.  HENT: Positive for hearing loss. Negative for congestion, dental problem, drooling, ear discharge, ear pain, facial swelling, mouth sores, nosebleeds, postnasal drip, rhinorrhea, sinus pressure, sneezing, sore throat, tinnitus, trouble swallowing and voice change.   Eyes: Negative for photophobia, pain, discharge, redness, itching and visual disturbance.  Respiratory: Positive for cough. Negative for apnea, choking, chest tightness, shortness of breath, wheezing and stridor.   Cardiovascular: Negative for chest pain,  palpitations and leg swelling.  Gastrointestinal: Negative for abdominal distention, abdominal pain, anal bleeding, blood in stool, constipation, diarrhea, nausea, rectal pain and vomiting.  Endocrine: Negative for cold intolerance, heat intolerance, polydipsia, polyphagia and polyuria.  Genitourinary: Negative for decreased urine volume, difficulty urinating, discharge, dysuria, enuresis, flank pain, frequency, genital sores, hematuria, penile pain, penile swelling, scrotal swelling, testicular pain and urgency.       Nocturia x 5.  Drinks water all day and all night.  Strong stream.    Musculoskeletal: Negative for arthralgias, back pain, gait problem, joint swelling, myalgias, neck pain and neck stiffness.  Skin: Negative for color change, pallor, rash and wound.  Allergic/Immunologic: Positive for environmental allergies. Negative for food allergies and immunocompromised state.  Neurological: Positive for weakness and headaches. Negative for dizziness, tremors, seizures, syncope, facial asymmetry, speech difficulty, light-headedness and numbness.  Hematological: Negative for adenopathy. Bruises/bleeds easily.  Psychiatric/Behavioral: Positive for sleep disturbance. Negative for agitation, behavioral problems, confusion, decreased concentration, dysphoric mood, hallucinations, self-injury and suicidal ideas. The patient is nervous/anxious. The patient is not hyperactive.     Past Medical History:  Diagnosis Date  . Allergy   . Anemia   . Anxiety   . Depression   . GERD (gastroesophageal reflux disease)   . History of kidney stones    episodes x1 -multiple stones- not a bother at this time.  . Hypertension    not on meds presently by MD action.  . Substance abuse [facility])    Past Surgical History:  Procedure Laterality Date  . COLONOSCOPY WITH PROPOFOL N/A 05/03/2015   Procedure: COLONOSCOPY WITH PROPOFOL;  Surgeon: Charolett BumpersMartin K Johnson, MD;  [facility];  Service: Endoscopy;   Laterality: N/A;  . ESOPHAGOGASTRODUODENOSCOPY (EGD) WITH PROPOFOL N/A 05/03/2015   Procedure: ESOPHAGOGASTRODUODENOSCOPY (EGD) WITH PROPOFOL;  Surgeon: Charolett BumpersMartin K Johnson, MD;  Location: [facility];  Service: Endoscopy;  Laterality: N/A;   Allergies  Allergen Reactions  . Carafate [Sucralfate] Other (See Comments)    "Made sick on stomach"   Current Outpatient Medications on File Prior to Visit  Medication Sig Dispense Refill  . acetaminophen (TYLENOL) 500 MG tablet Take 1,000 mg by mouth every 6 (six) hours as needed (For headache.).    Marland Kitchen. cetirizine (ZYRTEC) 10 MG tablet Take 1 tablet (10 mg total) by mouth daily. 30 tablet 11  . Multiple Vitamin (MULTIVITAMIN WITH MINERALS) TABS tablet Take 1 tablet by mouth daily.     No current facility-administered medications on file prior to visit.    Social History   Socioeconomic History  . Marital status: Single    Spouse name: Not on file  . Number of children: 0  . Years of education: 4 years college  . Highest education level: Not on file  Social Needs  . Financial resource strain: Not on file  . Food insecurity - worry: Not on file  . Food insecurity - inability: Not on file  . Transportation needs - medical: Not on file  . Transportation needs - non-medical: Not on file  Occupational History  . Occupation: Maintenance crew  Tobacco Use  . Smoking status: Current Every Day Smoker    Packs/day: 1.00    Years: 20.00    Pack years: 20.00    Types: Cigarettes  . Smokeless tobacco: Never Used  . Tobacco comment: Opiate use   Substance and Sexual Activity  . Alcohol use: No    Alcohol/week: 0.0 oz    Comment: Quit  2015 also - attends AA.  . Drug use: Yes    Types: Marijuana, Other-see comments, Heroin    Comment: Quit 04-02-13 Attends AA  . Sexual activity: Not Currently  Other Topics Concern  . Not on file  Social History Narrative   Marital status: single; not dating.  [place], [place]       Children:  None           Employment:  Maintenance at [employer] since 2014.      Tobacco:  1 ppd x 20 years.  Never quit.      Alcohol: in recovery; drinking 1/5 per day; duration 20 years; DWIs x 5; last DWI 09/2009.  Gets license back this month; four years without license.      Drugs:  Marijuana, prescription pills (pain medications Oxycontin, Vicodin, some benzos).  No heroine or cocaine.      Exercise:  Bike riding during summer months; weights at home.      Sexual activity: none in 2018; last sexual activity ten years.      Lives at home alone.   Right-handed.   No daily caffeine use.   Family History  Problem Relation Age of Onset  . Hyperlipidemia Mother   . Hypertension Mother   . Hyperlipidemia Brother   . Hypertension Brother   . Cancer Maternal Grandmother   . Hyperlipidemia Maternal Grandmother   . Hypertension Maternal Grandmother   . Hyperlipidemia Maternal Grandfather   . Hypertension Maternal Grandfather   . Diabetes Maternal Grandfather   . Hyperlipidemia Paternal Grandmother   . Hypertension Paternal Grandmother   . Hyperlipidemia Paternal Grandfather   .  Hypertension Paternal Grandfather   . Heart disease Paternal Grandfather        Objective:    BP 120/62   Pulse 98   Temp 99.3 F (37.4 C) (Oral)   Resp 16   Ht 6' 0.05" (1.83 m)   Wt 150 lb (68 kg)   SpO2 93%   BMI 20.32 kg/m  Physical Exam  Constitutional: He is oriented to person, place, and time. He appears well-developed and well-nourished. He appears ill. No distress.  HENT:  Head: Normocephalic and atraumatic.  Right Ear: External ear normal.  Left Ear: External ear normal.  Nose: Nose normal.  Mouth/Throat: Oropharynx is clear and moist.  Eyes: Conjunctivae and EOM are normal. Pupils are equal, round, and reactive to light.  Neck: Normal range of motion. Neck supple. Carotid bruit is not present. No thyromegaly present.  Cardiovascular: Normal rate, regular rhythm, normal heart sounds and  intact distal pulses. Exam reveals no gallop and no friction rub.  No murmur heard. Pulmonary/Chest: Effort normal and breath sounds normal. He has no wheezes. He has no rales.  Abdominal: Soft. Bowel sounds are normal. He exhibits no distension and no mass. There is no tenderness. There is no rebound and no guarding. Hernia confirmed negative in the right inguinal area and confirmed negative in the left inguinal area.  Genitourinary: Testes normal and penis normal.  Musculoskeletal:       Right shoulder: Normal.       Left shoulder: Normal.       Cervical back: Normal.  Lymphadenopathy:    He has no cervical adenopathy.       Right: No inguinal adenopathy present.       Left: No inguinal adenopathy present.  Neurological: He is alert and oriented to person, place, and time. He has normal reflexes. No cranial nerve deficit. He exhibits normal muscle tone. Coordination normal.  Skin: Skin is warm and dry. No rash noted. He is not diaphoretic.  Psychiatric: He has a normal mood and affect. His behavior is normal. Judgment and thought content normal.   No results found. Depression screen [facility] 2/9 12/19/2016 12/19/2016 12/05/2016 03/13/2016 02/08/2016  Decreased Interest 0 0 0 0 0  Down, Depressed, Hopeless 0 0 0 0 0  PHQ - 2 Score 0 0 0 0 0  Altered sleeping - - - - -  Tired, decreased energy - - - - -  Change in appetite - - - - -  Feeling bad or failure about yourself  - - - - -  Trouble concentrating - - - - -  Moving slowly or fidgety/restless - - - - -  Suicidal thoughts - - - - -  PHQ-9 Score - - - - -   Fall Risk  12/19/2016 12/19/2016 12/05/2016 03/13/2016 08/16/2015  Falls in the past year? No No No No No        Assessment & Plan:   1. Routine physical examination   2. Anxiety and depression   3. Psychophysiological insomnia   4. Tobacco abuse   5. Substance abuse in remission (HCC)   6. Herpes labialis   7. Screening, lipid   8. Screening for diabetes mellitus   9.  Screening for prostate cancer   10. Need for prophylactic vaccination and inoculation against viral hepatitis    -anticipatory guidance provided --- exercise, weight loss, safe driving practices, aspirin 81mg  daily. -obtain age appropriate screening labs and labs for chronic disease management. -worsening anxiety and depression; refer to  psychiatry.  Wean Zoloft therapy.  Decrease Zoloft 100 mg daily.  Initiate Prozac 20 mg 1 daily over the upcoming month.  Recommend psychotherapy for patient as well.  I do encourage patient to get a dog for companionship. 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 -Recovering well and recent influenza infection. -Maintaining recovery from alcoholism as well as other substances.  Congratulations on the success in having breathalyzer removed from vehicle. -GERD uncontrolled thus recommend restarting Protonix 40 mg once daily instead of omeprazole therapy.  Patient can continue ranitidine 150 mg at bedtime.    Orders Placed This Encounter  Procedures  . Hepatitis A vaccine adult IM  . Hepatitis B vaccine adult IM  . CBC with Differential/Platelet  . Comprehensive metabolic panel    Order Specific Question:   Has the patient fasted?    Answer:   No  . Hemoglobin A1c  . Lipid panel    Order Specific Question:   Has the patient fasted?    Answer:   No  . PSA  . TSH  . Vitamin B12  . VITAMIN D 25 Hydroxy (Vit-D Deficiency, Fractures)  . Ambulatory referral to Psychiatry    Referral Priority:   Routine    Referral Type:   Psychiatric    Referral Reason:   Specialty Services Required    Requested Specialty:   Psychiatry    Number of Visits Requested:   1  . POCT urinalysis dipstick   Meds ordered this encounter  Medications  . pantoprazole (PROTONIX) 40 MG tablet    Sig: TAKE 1 TABLET(40 MG) BY MOUTH DAILY    Dispense:  90 tablet    Refill:  3  . DISCONTD: sertraline (ZOLOFT) 100 MG tablet     Sig: Take 2 tablets (200 mg total) by mouth daily.    Dispense:  180 tablet    Refill:  1  . traZODone (DESYREL) 50 MG tablet    Sig: TAKE 1/2-1 TABLET BY MOUTH AT BEDTIME AS NEEDED FOR SLEEP    Dispense:  90 tablet    Refill:  0  . FLUoxetine (PROZAC) 20 MG tablet    Sig: Take 1 tablet (20 mg total) by mouth daily.    Dispense:  30 tablet    Refill:  3  . sertraline (ZOLOFT) 100 MG tablet    Sig: Take 1 tablet (100 mg total) by mouth at bedtime.    Dispense:  30 tablet    Refill:  1    Return in about 4 weeks (around 01/16/2017) for recheck.   Meria Crilly Paulita FujitaMartin Arnol Mcgibbon, M.D. Primary [facility] at [facility]  [place] [facility] [address] [place], [place]  [id number] [phone] phone [phone] fax

## 2016-12-19 NOTE — Patient Instructions (Addendum)
Start Fluoxetine/Prozac 7m every  Morning. Decrease Sertraline/Zoloft to 1026mevery morning.    IF you received an x-ray today, you will receive an invoice from [facility]. Please contact [facility] at [phone] questions or concerns regarding your invoice.   IF you received labwork today, you will receive an invoice from [facility] at [phone] questions or concerns regarding your invoice.   Our billing staff will not be able to assist you with questions regarding bills from these companies.  You will be contacted with the lab results as soon as they are available. The fastest way to get your results is to activate your My Chart account. Instructions are located on the last page of this paperwork. If you have not heard from [place] the results in 2 weeks, please contact this office.      Preventive Care 40-64 Years, Male Preventive care refers to lifestyle choices and visits with your health care provider that can promote health and wellness. What does preventive care include?  A yearly physical exam. This is also called an annual well check.  Dental exams once or twice a year.  Routine eye exams. Ask your health care provider how often you should have your eyes checked.  Personal lifestyle choices, including: ? Daily care of your teeth and gums. ? Regular physical activity. ? Eating a healthy diet. ? Avoiding tobacco and drug use. ? Limiting alcohol use. ? Practicing safe sex. ? Taking low-dose aspirin every day starting at age 8473What happens during an annual well check? The services and screenings done by your health care provider during your annual well check will depend on your age, overall health, lifestyle risk factors, and family history of disease. Counseling Your health care provider may ask you questions about your:  Alcohol use.  Tobacco use.  Drug use.  Emotional well-being.  Home and  relationship well-being.  Sexual activity.  Eating habits.  Work and work enStatistician Screening You may have the following tests or measurements:  Height, weight, and BMI.  Blood pressure.  Lipid and cholesterol levels. These may be checked every 5 years, or more frequently if you are over 5079ears old.  Skin check.  Lung cancer screening. You may have this screening every year starting at age 2486f you have a 30-pack-year history of smoking and currently smoke or have quit within the past 15 years.  Fecal occult blood test (FOBT) of the stool. You may have this test every year starting at age 84110 Flexible sigmoidoscopy or colonoscopy. You may have a sigmoidoscopy every 5 years or a colonoscopy every 10 years starting at age 47 Prostate cancer screening. Recommendations will vary depending on your family history and other risks.  Hepatitis C blood test.  Hepatitis B blood test.  Sexually transmitted disease (STD) testing.  Diabetes screening. This is done by checking your blood sugar (glucose) after you have not eaten for a while (fasting). You may have this done every 1-3 years.  Discuss your test results, treatment options, and if necessary, the need for more tests with your health care provider. Vaccines Your health care provider may recommend certain vaccines, such as:  Influenza vaccine. This is recommended every year.  Tetanus, diphtheria, and acellular pertussis (Tdap, Td) vaccine. You may need a Td booster every 10 years.  Varicella vaccine. You may need this if you have not been vaccinated.  Zoster vaccine. You may need this after age 47 Measles,  mumps, and rubella (MMR) vaccine. You may need at least one dose of MMR if you were born in 1957 or later. You may also need a second dose.  Pneumococcal 13-valent conjugate (PCV13) vaccine. You may need this if you have certain conditions and have not been vaccinated.  Pneumococcal polysaccharide (PPSV23)  vaccine. You may need one or two doses if you smoke cigarettes or if you have certain conditions.  Meningococcal vaccine. You may need this if you have certain conditions.  Hepatitis A vaccine. You may need this if you have certain conditions or if you travel or work in places where you may be exposed to hepatitis A.  Hepatitis B vaccine. You may need this if you have certain conditions or if you travel or work in places where you may be exposed to hepatitis B.  Haemophilus influenzae type b (Hib) vaccine. You may need this if you have certain risk factors.  Talk to your health care provider about which screenings and vaccines you need and how often you need them. This information is not intended to replace advice given to you by your health care provider. Make sure you discuss any questions you have with your health care provider. Document Released: 01/14/2015 Document Revised: 09/07/2015 Document Reviewed: 10/19/2014 Elsevier Interactive Patient Education  [employer].

## 2016-12-20 ENCOUNTER — Ambulatory Visit: Payer: BLUE CROSS/BLUE SHIELD | Admitting: Urgent Care

## 2016-12-20 ENCOUNTER — Telehealth: Payer: Self-pay | Admitting: Family Medicine

## 2016-12-20 DIAGNOSIS — E872 Acidosis, unspecified: Secondary | ICD-10-CM

## 2016-12-20 DIAGNOSIS — D500 Iron deficiency anemia secondary to blood loss (chronic): Secondary | ICD-10-CM

## 2016-12-20 LAB — COMPREHENSIVE METABOLIC PANEL
A/G RATIO: 2.3 — AB (ref 1.2–2.2)
ALBUMIN: 4.2 g/dL (ref 3.5–5.5)
ALT: 6 IU/L (ref 0–44)
ALT: 9 IU/L (ref 0–44)
AST: 12 IU/L (ref 0–40)
AST: 12 IU/L (ref 0–40)
Albumin/Globulin Ratio: 2.3 — ABNORMAL HIGH (ref 1.2–2.2)
Albumin: 4.2 g/dL (ref 3.5–5.5)
Alkaline Phosphatase: 36 IU/L — ABNORMAL LOW (ref 39–117)
Alkaline Phosphatase: 37 IU/L — ABNORMAL LOW (ref 39–117)
BUN / CREAT RATIO: 21 — AB (ref 9–20)
BUN/Creatinine Ratio: 19 (ref 9–20)
BUN: 20 mg/dL (ref 6–24)
BUN: 19 mg/dL (ref 6–24)
Bilirubin Total: <0.2 mg/dL (ref 0.0–1.2)
CALCIUM: 8.6 mg/dL — AB (ref 8.7–10.2)
CALCIUM: 9.2 mg/dL (ref 8.7–10.2)
CO2: 14 mmol/L — CL (ref 20–29)
CO2: 19 mmol/L — AB (ref 20–29)
CREATININE: 1.01 mg/dL (ref 0.76–1.27)
Chloride: 110 mmol/L — ABNORMAL HIGH (ref 96–106)
Chloride: 111 mmol/L — ABNORMAL HIGH (ref 96–106)
Creatinine, Ser: 0.96 mg/dL (ref 0.76–1.27)
GFR calc Af Amer: 102 mL/min/{1.73_m2} (ref >59)
GFR, EST AFRICAN AMERICAN: 108 mL/min/{1.73_m2} (ref >59)
GFR, EST NON AFRICAN AMERICAN: 94 mL/min/{1.73_m2} (ref >59)
GFR, EST NON AFRICAN AMERICAN: 88 mL/min/{1.73_m2} (ref >59)
GLUCOSE: 92 mg/dL (ref 65–99)
Globulin, Total: 1.8 g/dL (ref 1.5–4.5)
Globulin, Total: 1.8 g/dL (ref 1.5–4.5)
Glucose: 116 mg/dL — ABNORMAL HIGH (ref 65–99)
POTASSIUM: 3.8 mmol/L (ref 3.5–5.2)
Potassium: 3.9 mmol/L (ref 3.5–5.2)
Sodium: 140 mmol/L (ref 134–144)
Sodium: 144 mmol/L (ref 134–144)
TOTAL PROTEIN: 6 g/dL (ref 6.0–8.5)
Total Protein: 6 g/dL (ref 6.0–8.5)

## 2016-12-20 LAB — HEMOGLOBIN A1C
Est. average glucose Bld gHb Est-mCnc: 108 mg/dL
Hgb A1c MFr Bld: 5.4 % (ref 4.8–5.6)

## 2016-12-20 LAB — LIPID PANEL
CHOL/HDL RATIO: 4.3 ratio (ref 0.0–5.0)
CHOLESTEROL TOTAL: 147 mg/dL (ref 100–199)
HDL: 34 mg/dL — ABNORMAL LOW (ref >39)
LDL CALC: 85 mg/dL (ref 0–99)
TRIGLYCERIDES: 138 mg/dL (ref 0–149)
VLDL CHOLESTEROL CAL: 28 mg/dL (ref 5–40)

## 2016-12-20 LAB — CBC WITH DIFFERENTIAL/PLATELET
BASOS: 1 %
Basophils Absolute: 0 10*3/uL (ref 0.0–0.2)
Basophils Absolute: 0 x10E3/uL (ref 0.0–0.2)
Basos: 1 %
EOS (ABSOLUTE): 0.1 10*3/uL (ref 0.0–0.4)
EOS (ABSOLUTE): 0.1 x10E3/uL (ref 0.0–0.4)
EOS: 2 %
Eos: 1 %
HEMATOCRIT: 27.7 % — AB (ref 37.5–51.0)
HEMOGLOBIN: 8.6 g/dL — AB (ref 13.0–17.7)
Hematocrit: 28.3 % — ABNORMAL LOW (ref 37.5–51.0)
Hemoglobin: 8.9 g/dL — ABNORMAL LOW (ref 13.0–17.7)
IMMATURE GRANS (ABS): 0 10*3/uL (ref 0.0–0.1)
IMMATURE GRANULOCYTES: 0 %
Immature Grans (Abs): 0 x10E3/uL (ref 0.0–0.1)
Immature Granulocytes: 1 %
LYMPHS: 18 %
Lymphocytes Absolute: 1.4 10*3/uL (ref 0.7–3.1)
Lymphocytes Absolute: 1.3 x10E3/uL (ref 0.7–3.1)
Lymphs: 20 %
MCH: 27.3 pg (ref 26.6–33.0)
MCH: 27.8 pg (ref 26.6–33.0)
MCHC: 31 g/dL — ABNORMAL LOW (ref 31.5–35.7)
MCHC: 31.4 g/dL — ABNORMAL LOW (ref 31.5–35.7)
MCV: 88 fL (ref 79–97)
MCV: 88 fL (ref 79–97)
MONOCYTES: 5 %
Monocytes Absolute: 0.4 10*3/uL (ref 0.1–0.9)
Monocytes Absolute: 0.6 x10E3/uL (ref 0.1–0.9)
Monocytes: 8 %
NEUTROS ABS: 5.8 10*3/uL (ref 1.4–7.0)
NEUTROS PCT: 74 %
Neutrophils Absolute: 4.6 x10E3/uL (ref 1.4–7.0)
Neutrophils: 69 %
PLATELETS: 367 10*3/uL (ref 150–379)
Platelets: 346 x10E3/uL (ref 150–379)
RBC: 3.15 x10E6/uL — ABNORMAL LOW (ref 4.14–5.80)
RBC: 3.2 x10E6/uL — ABNORMAL LOW (ref 4.14–5.80)
RDW: 17.1 % — ABNORMAL HIGH (ref 12.3–15.4)
RDW: 17.1 % — ABNORMAL HIGH (ref 12.3–15.4)
WBC: 7.8 10*3/uL (ref 3.4–10.8)
WBC: 6.6 x10E3/uL (ref 3.4–10.8)

## 2016-12-20 LAB — TSH: TSH: 1.65 u[IU]/mL (ref 0.450–4.500)

## 2016-12-20 LAB — PSA: Prostate Specific Ag, Serum: 0.4 ng/mL (ref 0.0–4.0)

## 2016-12-20 LAB — VITAMIN D 25 HYDROXY (VIT D DEFICIENCY, FRACTURES): Vit D, 25-Hydroxy: 40.2 ng/mL (ref 30.0–100.0)

## 2016-12-20 LAB — VITAMIN B12: Vitamin B-12: 1153 pg/mL (ref 232–1245)

## 2016-12-20 NOTE — Telephone Encounter (Signed)
Called patient regarding lab results ---- low hemoglobin 8.9 and low CO2 14.  Patient feels well today.  Recent influenza dx two weeks ago and much improved. Denies SOB unless exerting self at work with shoveling.  Denies fever.  Denies alcohol intake or antifreeze intake.  Has noticed black stools twice per week for the past few weeks.  Not taking iron.  Taking Protonix once daily.  A/P: Recurrent anemia and metabolic acidosis:  New.  Patient asymptomatic; repeat labs today. To ED for decline in well-being; appointment with me on Saturday at 3:40pm unless labs have worsened and then will warrant immediate ED evaluation.

## 2016-12-22 ENCOUNTER — Ambulatory Visit (INDEPENDENT_AMBULATORY_CARE_PROVIDER_SITE_OTHER): Payer: BLUE CROSS/BLUE SHIELD

## 2016-12-22 ENCOUNTER — Ambulatory Visit (INDEPENDENT_AMBULATORY_CARE_PROVIDER_SITE_OTHER): Payer: BLUE CROSS/BLUE SHIELD | Admitting: Family Medicine

## 2016-12-22 ENCOUNTER — Encounter: Payer: Self-pay | Admitting: Family Medicine

## 2016-12-22 ENCOUNTER — Other Ambulatory Visit: Payer: Self-pay

## 2016-12-22 VITALS — BP 146/72 | HR 68 | Temp 98.3°F | Resp 18 | Ht 72.1 in | Wt 155.6 lb

## 2016-12-22 DIAGNOSIS — E872 Acidosis, unspecified: Secondary | ICD-10-CM

## 2016-12-22 DIAGNOSIS — J1182 Influenza due to unidentified influenza virus with myocarditis: Secondary | ICD-10-CM | POA: Diagnosis not present

## 2016-12-22 DIAGNOSIS — J111 Influenza due to unidentified influenza virus with other respiratory manifestations: Secondary | ICD-10-CM | POA: Diagnosis not present

## 2016-12-22 DIAGNOSIS — R3129 Other microscopic hematuria: Secondary | ICD-10-CM

## 2016-12-22 DIAGNOSIS — D500 Iron deficiency anemia secondary to blood loss (chronic): Secondary | ICD-10-CM | POA: Diagnosis not present

## 2016-12-22 LAB — POCT CBC
Granulocyte percent: 64.3 %G (ref 37–80)
HEMATOCRIT: 28.7 % — AB (ref 43.5–53.7)
Hemoglobin: 8.7 g/dL — AB (ref 14.1–18.1)
LYMPH, POC: 1.9 (ref 0.6–3.4)
MCH, POC: 27.3 pg (ref 27–31.2)
MCHC: 30.5 g/dL — AB (ref 31.8–35.4)
MCV: 89.6 fL (ref 80–97)
MID (cbc): 0.3 (ref 0–0.9)
MPV: 5.7 fL (ref 0–99.8)
POC GRANULOCYTE: 3.9 (ref 2–6.9)
POC LYMPH %: 31.6 % (ref 10–50)
POC MID %: 4.1 % (ref 0–12)
Platelet Count, POC: 339 10*3/uL (ref 142–424)
RBC: 3.2 M/uL — AB (ref 4.69–6.13)
RDW, POC: 18.7 %
WBC: 6.1 10*3/uL (ref 4.6–10.2)

## 2016-12-22 LAB — POCT URINALYSIS DIP (MANUAL ENTRY)
BILIRUBIN UA: NEGATIVE mg/dL
Bilirubin, UA: NEGATIVE
Glucose, UA: NEGATIVE mg/dL
LEUKOCYTES UA: NEGATIVE
Nitrite, UA: NEGATIVE
Protein Ur, POC: 100 mg/dL — AB
Spec Grav, UA: >=1.03 — AB (ref 1.010–1.025)
Urobilinogen, UA: 0.2 E.U./dL
pH, UA: 5.5 (ref 5.0–8.0)

## 2016-12-22 LAB — POC MICROSCOPIC URINALYSIS (UMFC): MUCUS RE: ABSENT

## 2016-12-22 MED ORDER — PANTOPRAZOLE SODIUM 40 MG PO TBEC: 40.0000 mg | DELAYED_RELEASE_TABLET | Freq: Two times a day (BID) | ORAL | 1 refills | Status: DC

## 2016-12-22 NOTE — Progress Notes (Signed)
Subjective:    Patient ID: Kyle Allen, male    DOB: 03/16/1970, 46 y.o.   MRN: [id number]  12/22/2016  Labs (f/u) and Depression (done)    HPI This 46 y.o. male presents for four day follow-up of recurrent anemia and metabolic acidosis.  Patient presented 4 days ago for routine physical examination.  Recently recovering from acute influenza infection.  Feeling 75% improved from influenza.  Denies recent fever, chills, sweats.  Fatigue has persisted.  Denies any abdominal pain, nausea, vomiting, diarrhea.  Denies melena or bloody stools.  Does admit that reflux has been significant and poorly controlled.  He is taking 1 Protonix every morning.  He is getting heartburn at 4 PM every afternoon however despite Protonix therapy.  Denies chest pain, shortness of breath, leg swelling.  Denies alcohol intake or other antifreeze or home product ingestion.  Denies any substance abuse at this time.  Went to work today and has worked all week.  History of kidney stones.  Urine with blood present at recent physical exam. One Protonix every morning.  Getting heartburn at 4:00pm every night.   BP Readings from Last 3 Encounters:  12/22/16 (!) 146/72  12/19/16 120/62  12/05/16 122/70   Wt Readings from Last 3 Encounters:  12/22/16 155 lb 9.6 oz (70.6 kg)  12/19/16 150 lb (68 kg)  12/05/16 150 lb (68 kg)   Immunization History  Administered Date(s) Administered  . Influenza Split 10/16/2014, 10/25/2015  . Influenza,inj,Quad PF,6+ Mos 09/16/2013  . Influenza-Unspecified 11/02/2016  . Tdap 09/16/2013    Review of Systems  Constitutional: Positive for fatigue. Negative for activity change, appetite change, chills, diaphoresis and fever.  Respiratory: Negative for cough and shortness of breath.   Cardiovascular: Negative for chest pain, palpitations and leg swelling.  Gastrointestinal: Negative for abdominal pain, anal bleeding, blood in stool, constipation, diarrhea, nausea, rectal pain and  vomiting.  Endocrine: Negative for cold intolerance, heat intolerance, polydipsia, polyphagia and polyuria.  Skin: Negative for color change, rash and wound.  Neurological: Negative for dizziness, tremors, seizures, syncope, facial asymmetry, speech difficulty, weakness, light-headedness, numbness and headaches.  Psychiatric/Behavioral: Negative for dysphoric mood and sleep disturbance. The patient is not nervous/anxious.     Past Medical History:  Diagnosis Date  . Allergy   . Anemia   . Anxiety   . Depression   . GERD (gastroesophageal reflux disease)   . History of kidney stones    episodes x1 -multiple stones- not a bother at this time.  . Hypertension    not on meds presently by MD action.  . Substance abuse [facility](HCC)    Past Surgical History:  Procedure Laterality Date  . COLONOSCOPY WITH PROPOFOL N/A 05/03/2015   Procedure: COLONOSCOPY WITH PROPOFOL;  Surgeon: Charolett BumpersMartin K Johnson, MD;  Location: [facility];  Service: Endoscopy;  Laterality: N/A;  . ESOPHAGOGASTRODUODENOSCOPY (EGD) WITH PROPOFOL N/A 05/03/2015   Procedure: ESOPHAGOGASTRODUODENOSCOPY (EGD) WITH PROPOFOL;  Surgeon: Charolett BumpersMartin K Johnson, MD;  Location: [facility];  Service: Endoscopy;  Laterality: N/A;   Allergies  Allergen Reactions  . Carafate [Sucralfate] Other (See Comments)    "Made sick on stomach"   Current Outpatient Medications on File Prior to Visit  Medication Sig Dispense Refill  . acetaminophen (TYLENOL) 500 MG tablet Take 1,000 mg by mouth every 6 (six) hours as needed (For headache.).    Marland Kitchen. cetirizine (ZYRTEC) 10 MG tablet Take 1 tablet (10 mg total) by mouth daily. 30 tablet 11  . FLUoxetine (PROZAC) 20 MG tablet  Take 1 tablet (20 mg total) by mouth daily. 30 tablet 3  . Multiple Vitamin (MULTIVITAMIN WITH MINERALS) TABS tablet Take 1 tablet by mouth daily.    . sertraline (ZOLOFT) 100 MG tablet Take 1 tablet (100 mg total) by mouth at bedtime. 30 tablet 1  . traZODone (DESYREL) 50 MG tablet TAKE 1/2-1  TABLET BY MOUTH AT BEDTIME AS NEEDED FOR SLEEP 90 tablet 0   No current facility-administered medications on file prior to visit.    Social History   Socioeconomic History  . Marital status: Single    Spouse name: Not on file  . Number of children: 0  . Years of education: 4 years college  . Highest education level: Not on file  Social Needs  . Financial resource strain: Not on file  . Food insecurity - worry: Not on file  . Food insecurity - inability: Not on file  . Transportation needs - medical: Not on file  . Transportation needs - non-medical: Not on file  Occupational History  . Occupation: Maintenance crew  Tobacco Use  . Smoking status: Current Every Day Smoker    Packs/day: 1.00    Years: 20.00    Pack years: 20.00    Types: Cigarettes  . Smokeless tobacco: Never Used  . Tobacco comment: Opiate use   Substance and Sexual Activity  . Alcohol use: No    Alcohol/week: 0.0 oz    Comment: Quit  2015 also - attends AA.  . Drug use: Yes    Types: Marijuana, Other-see comments, Heroin    Comment: Quit 04-02-13 Attends AA  . Sexual activity: Not Currently  Other Topics Concern  . Not on file  Social History Narrative   Marital status: single; not dating.  From [place], [place].       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      Tobacco:  1 ppd x 20 years.  Never quit.      Alcohol: in recovery; drinking 1/5 per day; duration 20 years; DWIs x 5; last DWI 09/2009.  Gets license back this month; four years without license.      Drugs:  Marijuana, prescription pills (pain medications Oxycontin, Vicodin, some benzos).  No heroine or cocaine.      Exercise:  Bike riding during summer months; weights at home.      Sexual activity: none in 2018; last sexual activity ten years.      Lives at home alone.   Right-handed.   No daily caffeine use.   Family History  Problem Relation Age of Onset  . Hyperlipidemia Mother     . Hypertension Mother   . Hyperlipidemia Brother   . Hypertension Brother   . Cancer Maternal Grandmother   . Hyperlipidemia Maternal Grandmother   . Hypertension Maternal Grandmother   . Hyperlipidemia Maternal Grandfather   . Hypertension Maternal Grandfather   . Diabetes Maternal Grandfather   . Hyperlipidemia Paternal Grandmother   . Hypertension Paternal Grandmother   . Hyperlipidemia Paternal Grandfather   . Hypertension Paternal Grandfather   . Heart disease Paternal Grandfather        Objective:    BP (!) 146/72 (BP Location: Right Arm, Patient Position: Sitting, Cuff Size: Normal)   Pulse 68   Temp 98.3 F (36.8 C) (Oral)   Resp 18   Ht 6' 0.1" (1.831 m)   Wt 155 lb 9.6 oz (70.6  kg)   BMI 21.04 kg/m  Physical Exam  Constitutional: He is oriented to person, place, and time. He appears well-developed and well-nourished. No distress.  HENT:  Head: Normocephalic and atraumatic.  Right Ear: External ear normal.  Left Ear: External ear normal.  Nose: Nose normal.  Mouth/Throat: Oropharynx is clear and moist.  Eyes: Conjunctivae and EOM are normal. Pupils are equal, round, and reactive to light.  Neck: Normal range of motion. Neck supple. Carotid bruit is not present. No thyromegaly present.  Cardiovascular: Normal rate, regular rhythm, normal heart sounds and intact distal pulses. Exam reveals no gallop and no friction rub.  No murmur heard. Pulmonary/Chest: Effort normal and breath sounds normal. He has no wheezes. He has no rales.  Abdominal: Soft. Bowel sounds are normal. He exhibits no distension and no mass. There is no tenderness. There is no rebound and no guarding.  Lymphadenopathy:    He has no cervical adenopathy.  Neurological: He is alert and oriented to person, place, and time. No cranial nerve deficit.  Skin: Skin is warm and dry. No rash noted. He is not diaphoretic.  Psychiatric: He has a normal mood and affect. His behavior is normal.  Nursing note  and vitals reviewed.  Dg Chest 2 View  Result Date: 12/22/2016 CLINICAL DATA:  46 year old male with influenza and metabolic acidosis EXAM: CHEST  2 VIEW COMPARISON:  Prior chest x-ray 03/08/2015 FINDINGS: The lungs are clear and negative for focal airspace consolidation, pulmonary edema or suspicious pulmonary nodule. No pleural effusion or pneumothorax. Cardiac and mediastinal contours are within normal limits. No acute fracture or lytic or blastic osseous lesions. The visualized upper abdominal bowel gas pattern is unremarkable. IMPRESSION: Negative chest x-ray. Electronically Signed   By: Malachy Moan M.D.   On: 12/22/2016 16:19   Depression screen [facility] 2/9 12/22/2016 12/19/2016 12/19/2016 12/05/2016 03/13/2016  Decreased Interest 0 0 0 0 0  Down, Depressed, Hopeless 1 0 0 0 0  PHQ - 2 Score 1 0 0 0 0  Altered sleeping 3 - - - -  Tired, decreased energy 3 - - - -  Change in appetite 1 - - - -  Feeling bad or failure about yourself  1 - - - -  Trouble concentrating 2 - - - -  Moving slowly or fidgety/restless 1 - - - -  Suicidal thoughts 0 - - - -  PHQ-9 Score 12 - - - -  Difficult doing work/chores Somewhat difficult - - - -   Fall Risk  12/22/2016 12/19/2016 12/19/2016 12/05/2016 03/13/2016  Falls in the past year? No No No No No        Assessment & Plan:   1. Iron deficiency anemia due to chronic blood loss   2. Metabolic acidosis   3. Influenza with respiratory manifestation   4. Microscopic hematuria     Recurrent iron deficiency anemia likely due to GI blood loss.  History of gastric ulcers in the past 2 or 3 years.  Repeat hemoglobin today.  Hemosure also provided for home completion.  Increase iron to twice daily dosing.  Increase Protonix to twice daily.  New onset metabolic acidosis it physical 4 days ago.  Repeat bicarb level today.  Patient denies substance abuse at this time.  Recent influenza infection with normal recovery.  Chest x-ray obtained today no evidence  of pneumonia.  Microscopic hematuria is new.  Patient does have a history of kidney stones.  Repeat urinalysis today.  Due to tobacco  history, will warrant referral to urology if microscopic hematuria persist.  Orders Placed This Encounter  Procedures  . DG Chest 2 View    Standing Status:   Future    Number of Occurrences:   1    Standing Expiration Date:   02/22/2018    Order Specific Question:   Reason for Exam (SYMPTOM  OR DIAGNOSIS REQUIRED)    Answer:   influenza; metabolic acidosis    Order Specific Question:   Preferred imaging location?    Answer:   External    Order Specific Question:   Radiology Contrast Protocol - do NOT remove file path    Answer:   [email or web address]  . Comprehensive metabolic panel  . POCT CBC  . POCT urinalysis dipstick  . POCT Microscopic Urinalysis ([facility])  . IFOBT POC (occult bld, rslt in office)    Standing Status:   Future    Standing Expiration Date:   12/22/2017   Meds ordered this encounter  Medications  . pantoprazole (PROTONIX) 40 MG tablet    Sig: Take 1 tablet (40 mg total) by mouth 2 (two) times daily before a meal. TAKE 1 TABLET(40 MG) BY MOUTH DAILY    Dispense:  180 tablet    Refill:  1    No Follow-up on file.   Kristi Paulita FujitaMartin Smith, M.D. [facility]   previously [facility] [address] [place], [place]  [id number] [phone] phone [phone] fax

## 2016-12-22 NOTE — Patient Instructions (Addendum)
  Increase Protonix to twice daily. Increase iron tablet to twice daily. Complete stool cards and return to our office.   IF you received an x-ray today, you will receive an invoice from [facility]. Please contact [facility] at [phone] with questions or concerns regarding your invoice.   IF you received labwork today, you will receive an invoice from [facility]. Please contact [facility] at [phone] with questions or concerns regarding your invoice.   Our billing staff will not be able to assist you with questions regarding bills from these companies.  You will be contacted with the lab results as soon as they are available. The fastest way to get your results is to activate your My Chart account. Instructions are located on the last page of this paperwork. If you have not heard from us regarding the results in 2 weeks, please contact this office.

## 2016-12-25 ENCOUNTER — Other Ambulatory Visit: Payer: Self-pay | Admitting: Family Medicine

## 2016-12-25 LAB — SPECIMEN STATUS

## 2016-12-26 ENCOUNTER — Ambulatory Visit: Payer: BLUE CROSS/BLUE SHIELD | Admitting: Family Medicine

## 2016-12-28 ENCOUNTER — Other Ambulatory Visit: Payer: Self-pay | Admitting: Family Medicine

## 2016-12-31 LAB — COMPREHENSIVE METABOLIC PANEL
ALT: 7 IU/L (ref 0–44)
AST: 10 IU/L (ref 0–40)
Albumin/Globulin Ratio: 2.1 (ref 1.2–2.2)
Albumin: 4.2 g/dL (ref 3.5–5.5)
Alkaline Phosphatase: 39 IU/L (ref 39–117)
BUN/Creatinine Ratio: 18 (ref 9–20)
BUN: 16 mg/dL (ref 6–24)
Bilirubin Total: <0.2 mg/dL (ref 0.0–1.2)
CALCIUM: 9.1 mg/dL (ref 8.7–10.2)
CO2: 18 mmol/L — AB (ref 20–29)
CREATININE: 0.91 mg/dL (ref 0.76–1.27)
Chloride: 109 mmol/L — ABNORMAL HIGH (ref 96–106)
GFR calc Af Amer: 116 mL/min/{1.73_m2} (ref >59)
GFR, EST NON AFRICAN AMERICAN: 100 mL/min/{1.73_m2} (ref >59)
GLOBULIN, TOTAL: 2 g/dL (ref 1.5–4.5)
GLUCOSE: 93 mg/dL (ref 65–99)
Potassium: 4.5 mmol/L (ref 3.5–5.2)
SODIUM: 145 mmol/L — AB (ref 134–144)
Total Protein: 6.2 g/dL (ref 6.0–8.5)

## 2017-01-07 NOTE — Telephone Encounter (Signed)
Copied from CRM [phone]. Topic: Inquiry >> Jan 07, 2017  4:05 PM Raquel SarnaHayes, Teresa G wrote: [facility] [address] [phone]  asking for directions for dosage on  Pantoprazole 40 mg.  Pharmacy was given 2 different directions for dosage.  Please call to clarify for future refills.

## 2017-01-09 ENCOUNTER — Encounter: Payer: Self-pay | Admitting: Family Medicine

## 2017-01-11 NOTE — Telephone Encounter (Unsigned)
Copied from CRM [phone]. Topic: Inquiry >> Jan 11, 2017  3:12 PM Viviann SpareWhite, Selina wrote: Reason for CRM: [facility] to get clear direction on the following RX pantoprazole (PROTONIX) 40 MG tablet. Pharmacist stated there are 2 different directions on the RX.

## 2017-01-12 ENCOUNTER — Other Ambulatory Visit: Payer: Self-pay | Admitting: Family Medicine

## 2017-01-12 DIAGNOSIS — G4710 Hypersomnia, unspecified: Secondary | ICD-10-CM

## 2017-01-12 NOTE — Progress Notes (Signed)
refer

## 2017-01-14 ENCOUNTER — Encounter: Payer: Self-pay | Admitting: Family Medicine

## 2017-01-14 MED ORDER — FLUOXETINE HCL 20 MG PO CAPS: 20.0000 mg | ORAL_CAPSULE | Freq: Every day | ORAL | 3 refills | Status: DC

## 2017-01-14 NOTE — Telephone Encounter (Signed)
Requesting refill on zoloft. Atrovent has been d/cd.

## 2017-01-14 NOTE — Telephone Encounter (Signed)
Patient is requesting Atrovent 0.03% nasal spray, last refill 12/05/16. Also, in the office visit notes patient is on Prozac. Please advise about the Atrovent refill, patient has an appointment 01/28/2017.

## 2017-01-15 ENCOUNTER — Other Ambulatory Visit: Payer: Self-pay | Admitting: Family Medicine

## 2017-01-15 DIAGNOSIS — D500 Iron deficiency anemia secondary to blood loss (chronic): Secondary | ICD-10-CM

## 2017-01-15 DIAGNOSIS — Z8711 Personal history of peptic ulcer disease: Secondary | ICD-10-CM

## 2017-01-16 ENCOUNTER — Encounter: Payer: Self-pay | Admitting: Family Medicine

## 2017-01-28 ENCOUNTER — Ambulatory Visit: Payer: BLUE CROSS/BLUE SHIELD | Admitting: Family Medicine

## 2017-02-20 ENCOUNTER — Encounter: Payer: Self-pay | Admitting: Family Medicine

## 2017-02-21 ENCOUNTER — Telehealth: Payer: Self-pay | Admitting: Family Medicine

## 2017-02-21 NOTE — Telephone Encounter (Signed)
Copied from CRM [id number]. Topic: General - Other >> Feb 21, 2017  3:24 PM Cecelia ByarsGreen, Stanislawa Gaffin L, RMA wrote: Reason for CRM: Lurena JoinerRebecca from [facility] is calling to notify Dr. Katrinka BlazingSmith that pt did not show up to appt today

## 2017-03-01 DIAGNOSIS — Z79899 Other long term (current) drug therapy: Secondary | ICD-10-CM | POA: Diagnosis not present

## 2017-03-01 DIAGNOSIS — F331 Major depressive disorder, recurrent, moderate: Secondary | ICD-10-CM | POA: Diagnosis not present

## 2017-03-12 ENCOUNTER — Ambulatory Visit: Payer: BLUE CROSS/BLUE SHIELD | Admitting: Emergency Medicine

## 2017-03-12 ENCOUNTER — Encounter: Payer: Self-pay | Admitting: Emergency Medicine

## 2017-03-12 ENCOUNTER — Ambulatory Visit: Payer: BLUE CROSS/BLUE SHIELD | Admitting: Family Medicine

## 2017-03-12 VITALS — BP 127/69 | HR 79 | Temp 100.0°F | Resp 17 | Ht 72.5 in | Wt 149.0 lb

## 2017-03-12 DIAGNOSIS — J22 Unspecified acute lower respiratory infection: Secondary | ICD-10-CM | POA: Insufficient documentation

## 2017-03-12 DIAGNOSIS — F17200 Nicotine dependence, unspecified, uncomplicated: Secondary | ICD-10-CM | POA: Diagnosis not present

## 2017-03-12 DIAGNOSIS — R05 Cough: Secondary | ICD-10-CM

## 2017-03-12 DIAGNOSIS — R059 Cough, unspecified: Secondary | ICD-10-CM | POA: Insufficient documentation

## 2017-03-12 MED ORDER — AZITHROMYCIN 250 MG PO TABS: ORAL_TABLET | ORAL | 0 refills | Status: DC

## 2017-03-12 MED ORDER — BENZONATATE 200 MG PO CAPS: 200.0000 mg | ORAL_CAPSULE | Freq: Two times a day (BID) | ORAL | 0 refills | Status: DC | PRN

## 2017-03-12 MED ORDER — PROMETHAZINE-CODEINE 6.25-10 MG/5ML PO SYRP: 5.0000 mL | ORAL_SOLUTION | Freq: Every evening | ORAL | 0 refills | Status: DC | PRN

## 2017-03-12 NOTE — Patient Instructions (Addendum)
     IF you received an x-ray today, you will receive an invoice from [facility]. Please contact [facility] at [phone] with questions or concerns regarding your invoice.   IF you received labwork today, you will receive an invoice from [facility]. Please contact [facility] at [phone] with questions or concerns regarding your invoice.   Our billing staff will not be able to assist you with questions regarding bills from these companies.  You will be contacted with the lab results as soon as they are available. The fastest way to get your results is to activate your My Chart account. Instructions are located on the last page of this paperwork. If you have not heard from us regarding the results in 2 weeks, please contact this office.     Cough Cough, Adult A cough helps to clear your throat and lungs. A cough may last only 2-3 weeks (acute), or it may last longer than 8 weeks (chronic). Many different things can cause a cough. A cough may be a sign of an illness or another medical condition. Follow these instructions at home:  Pay attention to any changes in your cough.  Take medicines only as told by your doctor. ? If you were prescribed an antibiotic medicine, take it as told by your doctor. Do not stop taking it even if you start to feel better. ? Talk with your doctor before you try using a cough medicine.  Drink enough fluid to keep your pee (urine) clear or pale yellow.  If the air is dry, use a cold steam vaporizer or humidifier in your home.  Stay away from things that make you cough at work or at home.  If your cough is worse at night, try using extra pillows to raise your head up higher while you sleep.  Do not smoke, and try not to be around smoke. If you need help quitting, ask your doctor.  Do not have caffeine.  Do not drink alcohol.  Rest as needed. Contact a doctor if:  You have new problems (symptoms).  You cough up yellow fluid  (pus).  Your cough does not get better after 2-3 weeks, or your cough gets worse.  Medicine does not help your cough and you are not sleeping well.  You have pain that gets worse or pain that is not helped with medicine.  You have a fever.  You are losing weight and you do not know why.  You have night sweats. Get help right away if:  You cough up blood.  You have trouble breathing.  Your heartbeat is very fast. This information is not intended to replace advice given to you by your health care provider. Make sure you discuss any questions you have with your health care provider. Document Released: 08/31/2010 Document Revised: 05/26/2015 Document Reviewed: 02/24/2014 Elsevier Interactive Patient Education  [employer]2018 [employer]

## 2017-03-12 NOTE — Progress Notes (Signed)
Kyle Allen 47 y.o.   Chief Complaint  Patient presents with  . URI    HISTORY OF PRESENT ILLNESS: This is a 47 y.o. male chronic smoker complaining of a 4-day history of persistent cough.  States cough feels wet but he cannot get up the phlegm.  Not sleeping.  Head feels heavy.  Has been taking DayQuil and cough drops.  Denies fever or chills.  Feels very congested.  No other significant symptoms.  Cough  This is a new problem. The current episode started in the past 7 days. The problem has been gradually worsening. The problem occurs every few minutes. The cough is non-productive. Associated symptoms include headaches and nasal congestion. Pertinent negatives include no chest pain, chills, ear pain, eye redness, fever, hemoptysis, myalgias, rash, rhinorrhea, sore throat, shortness of breath, weight loss or wheezing. Risk factors for lung disease include smoking/tobacco exposure. He has tried OTC cough suppressant for the symptoms. The treatment provided no relief. There is no history of asthma, COPD, emphysema or pneumonia.     Prior to Admission medications   Medication Sig Start Date End Date Taking? Authorizing Provider  acetaminophen (TYLENOL) 500 MG tablet Take 1,000 mg by mouth every 6 (six) hours as needed (For headache.).   Yes [provider]  buPROPion (WELLBUTRIN) 100 MG tablet Take 100 mg by mouth 2 (two) times daily.   Yes [provider]  cetirizine (ZYRTEC) 10 MG tablet Take 1 tablet (10 mg total) by mouth daily. 04/05/16  Yes Ethelda Chick, MD  Multiple Vitamin (MULTIVITAMIN WITH MINERALS) TABS tablet Take 1 tablet by mouth daily.   Yes [provider]  pantoprazole (PROTONIX) 40 MG tablet Take 1 tablet (40 mg total) by mouth 2 (two) times daily before a meal. TAKE 1 TABLET(40 MG) BY MOUTH DAILY 12/22/16  Yes Ethelda Chick, MD  FLUoxetine (PROZAC) 20 MG capsule Take 1 capsule (20 mg total) by mouth daily. Patient not taking: Reported on  03/12/2017 01/14/17   Ethelda Chick, MD    Allergies  Allergen Reactions  . Carafate [Sucralfate] Other (See Comments)    "Made sick on stomach"    Patient Active Problem List   Diagnosis Date Noted  . Mandibular pain 03/26/2016  . Numbness 03/26/2016  . Anxiety and depression 09/18/2013  . Insomnia 09/18/2013  . Tobacco abuse 09/18/2013  . Substance abuse in remission (HCC) 09/18/2013  . Herpes labialis 09/18/2013    Past Medical History:  Diagnosis Date  . Allergy   . Anemia   . Anxiety   . Depression   . GERD (gastroesophageal reflux disease)   . History of kidney stones    episodes x1 -multiple stones- not a bother at this time.  . Hypertension    not on meds presently by MD action.  . Substance abuse [facility])     Past Surgical History:  Procedure Laterality Date  . COLONOSCOPY WITH PROPOFOL N/A 05/03/2015   Procedure: COLONOSCOPY WITH PROPOFOL;  Surgeon: Charolett Bumpers, MD;  Location: [facility];  Service: Endoscopy;  Laterality: N/A;  . ESOPHAGOGASTRODUODENOSCOPY (EGD) WITH PROPOFOL N/A 05/03/2015   Procedure: ESOPHAGOGASTRODUODENOSCOPY (EGD) WITH PROPOFOL;  Surgeon: Charolett Bumpers, MD;  Location: [facility];  Service: Endoscopy;  Laterality: N/A;    Social History   Socioeconomic History  . Marital status: Single    Spouse name: Not on file  . Number of children: 0  . Years of education: 4 years college  . Highest education level: Not on  file  Social Needs  . Financial resource strain: Not on file  . Food insecurity - worry: Not on file  . Food insecurity - inability: Not on file  . Transportation needs - medical: Not on file  . Transportation needs - non-medical: Not on file  Occupational History  . Occupation: Maintenance crew  Tobacco Use  . Smoking status: Current Every Day Smoker    Packs/day: 1.00    Years: 20.00    Pack years: 20.00    Types: Cigarettes  . Smokeless tobacco: Never Used  . Tobacco comment: Opiate use   Substance and  Sexual Activity  . Alcohol use: No    Alcohol/week: 0.0 oz    Comment: Quit  2015 also - attends AA.  . Drug use: Yes    Types: Marijuana, Other-see comments, Heroin    Comment: Quit 04-02-13 Attends AA  . Sexual activity: Not Currently  Other Topics Concern  . Not on file  Social History Narrative   Marital status: single; not dating.  From [place], [place].       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      Tobacco:  1 ppd x 20 years.  Never quit.      Alcohol: in recovery; drinking 1/5 per day; duration 20 years; DWIs x 5; last DWI 09/2009.  Gets license back this month; four years without license.      Drugs:  Marijuana, prescription pills (pain medications Oxycontin, Vicodin, some benzos).  No heroine or cocaine.      Exercise:  Bike riding during summer months; weights at home.      Sexual activity: none in 2018; last sexual activity ten years.      Lives at home alone.   Right-handed.   No daily caffeine use.    Family History  Problem Relation Age of Onset  . Hyperlipidemia Mother   . Hypertension Mother   . Hyperlipidemia Brother   . Hypertension Brother   . Cancer Maternal Grandmother   . Hyperlipidemia Maternal Grandmother   . Hypertension Maternal Grandmother   . Hyperlipidemia Maternal Grandfather   . Hypertension Maternal Grandfather   . Diabetes Maternal Grandfather   . Hyperlipidemia Paternal Grandmother   . Hypertension Paternal Grandmother   . Hyperlipidemia Paternal Grandfather   . Hypertension Paternal Grandfather   . Heart disease Paternal Grandfather      Review of Systems  Constitutional: Negative.  Negative for chills, fever and weight loss.  HENT: Positive for congestion. Negative for ear pain, rhinorrhea and sore throat.   Eyes: Negative.  Negative for discharge and redness.  Respiratory: Positive for cough. Negative for hemoptysis, shortness of breath and wheezing.   Cardiovascular:  Negative.  Negative for chest pain, palpitations and leg swelling.  Gastrointestinal: Negative.  Negative for abdominal pain, diarrhea, nausea and vomiting.  Genitourinary: Negative.  Negative for dysuria, frequency and hematuria.  Musculoskeletal: Negative for back pain, myalgias and neck pain.  Skin: Negative.  Negative for rash.  Neurological: Positive for headaches. Negative for sensory change and focal weakness.  Endo/Heme/Allergies: Negative.   All other systems reviewed and are negative.   Vitals:   03/12/17 1002  BP: 127/69  Pulse: 79  Resp: 17  Temp: 100 F (37.8 C)  SpO2: 98%    Physical Exam  Constitutional: He is oriented to person, place, and time. He appears well-developed and well-nourished.  HENT:  Head: Normocephalic and atraumatic.  Right Ear: External ear normal.  Left Ear: External ear normal.  Nose: Nose normal.  Mouth/Throat: Oropharynx is clear and moist.  Eyes: Conjunctivae and EOM are normal. Pupils are equal, round, and reactive to light.  Neck: Normal range of motion. Neck supple. No JVD present. No thyromegaly present.  Cardiovascular: Normal rate, regular rhythm and normal heart sounds.  Pulmonary/Chest: Effort normal and breath sounds normal.  Abdominal: Soft. There is no tenderness.  Musculoskeletal: Normal range of motion.  Lymphadenopathy:    He has no cervical adenopathy.  Neurological: He is alert and oriented to person, place, and time. No sensory deficit. He exhibits normal muscle tone.  Skin: Skin is warm and dry. Capillary refill takes less than 2 seconds.  Psychiatric: He has a normal mood and affect. His behavior is normal.  Vitals reviewed.    ASSESSMENT & PLAN: Rolm Galarik was seen today for uri.  Diagnoses and all orders for this visit:  Cough  Lower respiratory infection -     azithromycin (ZITHROMAX) 250 MG tablet; Sig as indicated  Smoker -     benzonatate (TESSALON) 200 MG capsule; Take 1 capsule (200 mg total) by mouth 2  (two) times daily as needed for cough. -     promethazine-codeine (PHENERGAN WITH CODEINE) 6.25-10 MG/5ML syrup; Take 5 mLs by mouth at bedtime as needed for cough.    Patient Instructions       IF you received an x-ray today, you will receive an invoice from [facility]. Please contact [facility] at [phone] with questions or concerns regarding your invoice.   IF you received labwork today, you will receive an invoice from [facility]. Please contact [facility] at [phone] with questions or concerns regarding your invoice.   Our billing staff will not be able to assist you with questions regarding bills from these companies.  You will be contacted with the lab results as soon as they are available. The fastest way to get your results is to activate your My Chart account. Instructions are located on the last page of this paperwork. If you have not heard from us regarding the results in 2 weeks, please contact this office.     Cough Cough, Adult A cough helps to clear your throat and lungs. A cough may last only 2-3 weeks (acute), or it may last longer than 8 weeks (chronic). Many different things can cause a cough. A cough may be a sign of an illness or another medical condition. Follow these instructions at home:  Pay attention to any changes in your cough.  Take medicines only as told by your doctor. ? If you were prescribed an antibiotic medicine, take it as told by your doctor. Do not stop taking it even if you start to feel better. ? Talk with your doctor before you try using a cough medicine.  Drink enough fluid to keep your pee (urine) clear or pale yellow.  If the air is dry, use a cold steam vaporizer or humidifier in your home.  Stay away from things that make you cough at work or at home.  If your cough is worse at night, try using extra pillows to raise your head up higher while you sleep.  Do not smoke, and try not to be around smoke. If you  need help quitting, ask your doctor.  Do not have caffeine.  Do not drink alcohol.  Rest as needed. Contact a doctor if:  You have new problems (symptoms).  You cough up yellow fluid (pus).  Your cough does not get better after 2-3 weeks, or your cough gets worse.  Medicine does not help your cough and you are not sleeping well.  You have pain that gets worse or pain that is not helped with medicine.  You have a fever.  You are losing weight and you do not know why.  You have night sweats. Get help right away if:  You cough up blood.  You have trouble breathing.  Your heartbeat is very fast. This information is not intended to replace advice given to you by your health care provider. Make sure you discuss any questions you have with your health care provider. Document Released: 08/31/2010 Document Revised: 05/26/2015 Document Reviewed: 02/24/2014 Elsevier Interactive Patient Education  2018 Elsevier Inc.      Edwina Barth, MD [facility]

## 2017-03-13 ENCOUNTER — Encounter: Payer: Self-pay | Admitting: Family Medicine

## 2017-03-13 ENCOUNTER — Ambulatory Visit: Payer: Self-pay | Admitting: Family Medicine

## 2017-03-16 ENCOUNTER — Encounter: Payer: Self-pay | Admitting: Emergency Medicine

## 2017-03-17 ENCOUNTER — Encounter: Payer: Self-pay | Admitting: Family Medicine

## 2017-03-18 NOTE — Telephone Encounter (Signed)
Taken care of. Thanks!

## 2017-03-20 ENCOUNTER — Other Ambulatory Visit: Payer: Self-pay

## 2017-03-20 ENCOUNTER — Ambulatory Visit: Payer: BLUE CROSS/BLUE SHIELD | Admitting: Family Medicine

## 2017-03-20 ENCOUNTER — Encounter: Payer: Self-pay | Admitting: Family Medicine

## 2017-03-20 VITALS — BP 124/60 | HR 87 | Temp 99.5°F | Ht 72.44 in | Wt 147.4 lb

## 2017-03-20 DIAGNOSIS — D500 Iron deficiency anemia secondary to blood loss (chronic): Secondary | ICD-10-CM

## 2017-03-20 DIAGNOSIS — F17200 Nicotine dependence, unspecified, uncomplicated: Secondary | ICD-10-CM | POA: Diagnosis not present

## 2017-03-20 DIAGNOSIS — R05 Cough: Secondary | ICD-10-CM

## 2017-03-20 DIAGNOSIS — G933 Postviral fatigue syndrome: Secondary | ICD-10-CM | POA: Diagnosis not present

## 2017-03-20 DIAGNOSIS — K219 Gastro-esophageal reflux disease without esophagitis: Secondary | ICD-10-CM | POA: Diagnosis not present

## 2017-03-20 DIAGNOSIS — F1911 Other psychoactive substance abuse, in remission: Secondary | ICD-10-CM

## 2017-03-20 DIAGNOSIS — R634 Abnormal weight loss: Secondary | ICD-10-CM | POA: Diagnosis not present

## 2017-03-20 DIAGNOSIS — F329 Major depressive disorder, single episode, unspecified: Secondary | ICD-10-CM | POA: Diagnosis not present

## 2017-03-20 DIAGNOSIS — R059 Cough, unspecified: Secondary | ICD-10-CM

## 2017-03-20 DIAGNOSIS — J0100 Acute maxillary sinusitis, unspecified: Secondary | ICD-10-CM

## 2017-03-20 DIAGNOSIS — F32A Depression, unspecified: Secondary | ICD-10-CM

## 2017-03-20 DIAGNOSIS — F419 Anxiety disorder, unspecified: Secondary | ICD-10-CM | POA: Diagnosis not present

## 2017-03-20 DIAGNOSIS — G9331 Postviral fatigue syndrome: Secondary | ICD-10-CM

## 2017-03-20 MED ORDER — PREDNISONE 20 MG PO TABS: ORAL_TABLET | ORAL | 0 refills | Status: DC

## 2017-03-20 MED ORDER — AMOXICILLIN-POT CLAVULANATE 875-125 MG PO TABS: 1.0000 | ORAL_TABLET | Freq: Two times a day (BID) | ORAL | 0 refills | Status: DC

## 2017-03-20 NOTE — Progress Notes (Signed)
Subjective:    Patient ID: Kyle Allen, male    DOB: [birth year], 47 y.o.   MRN: [id number]  03/20/2017  Chronic Conditions (3 month F/u also) and Fatigue ([place] on for a couple of month with having the Flu and Bronchitist)    HPI This 47 y.o. male presents for evaluation of fatigue, bronchitis, depression with anxiety, insomnia, anemia, weight loss.   Stopped iron because going to complete stool samples.   Restarted iron tablet with diagnosis of flu.  Anxiety and depression: started on Wellbutrin; changed Trazodone to Seroquel.  Unable to wake up on Seroquel.  Taking Wellbutrin.  Not sleeping well with bronchitis. Drinks more protein shakes and ensure.  Not sure if eating.  Food does not taste appealing; too tired to make food.  Will make a plate of food and will wake up with food on lap or on floor.  No stomach pain; taking Omeprazole one daily OTC.    Taking MVI and iron and ready to go.   Has a dog now.  Paws for people for support dog.  Trained by prisoners to become service dogs.  Dog is trained ot pull zipper down; turns off light switches.   Crate trained.   Potty trained.   Advertising account planner; chews everything.  Puppy.   A lot of work.  Food tastes different; pickles are horrible.  Follow-up in one month with psychiatry. Will meet with therapist.   BP Readings from Last 3 Encounters:  03/20/17 124/60  03/12/17 127/69  12/22/16 (!) 146/72   Wt Readings from Last 3 Encounters:  03/20/17 147 lb 6.4 oz (66.9 kg)  03/12/17 149 lb (67.6 kg)  12/22/16 155 lb 9.6 oz (70.6 kg)   Immunization History  Administered Date(s) Administered  . Hepatitis A, Adult 12/19/2016  . Hepatitis B, adult 12/19/2016  . Influenza Split 10/16/2014, 10/25/2015  . Influenza,inj,Quad PF,6+ Mos 09/16/2013  . Influenza-Unspecified 11/02/2016  . Tdap 09/16/2013    Review of Systems  Constitutional: Positive for fatigue and unexpected weight change. Negative for activity change, appetite  change, chills, diaphoresis and fever.  HENT: Positive for congestion, postnasal drip and rhinorrhea. Negative for dental problem, drooling, ear discharge, ear pain, facial swelling, hearing loss, mouth sores, nosebleeds, sinus pressure, sinus pain, sneezing, sore throat, tinnitus, trouble swallowing and voice change.   Eyes: Negative for photophobia, pain, discharge, redness, itching and visual disturbance.  Respiratory: Positive for cough and wheezing. Negative for apnea, choking, chest tightness, shortness of breath and stridor.   Cardiovascular: Negative for chest pain, palpitations and leg swelling.  Gastrointestinal: Negative for abdominal distention, abdominal pain, anal bleeding, blood in stool, constipation, diarrhea, nausea and vomiting.  Endocrine: Negative for cold intolerance, heat intolerance, polydipsia, polyphagia and polyuria.  Genitourinary: Negative for decreased urine volume, difficulty urinating, discharge, dysuria, enuresis, flank pain, frequency, genital sores, hematuria, penile pain, penile swelling, scrotal swelling, testicular pain and urgency.  Musculoskeletal: Negative for arthralgias, back pain, gait problem, joint swelling, myalgias, neck pain and neck stiffness.  Skin: Negative for color change, pallor, rash and wound.  Allergic/Immunologic: Negative for environmental allergies, food allergies and immunocompromised state.  Neurological: Negative for dizziness, tremors, seizures, syncope, facial asymmetry, speech difficulty, weakness, light-headedness, numbness and headaches.  Hematological: Negative for adenopathy. Does not bruise/bleed easily.  Psychiatric/Behavioral: Positive for dysphoric mood and sleep disturbance. Negative for agitation, behavioral problems, confusion, decreased concentration, hallucinations, self-injury and suicidal ideas. The patient is nervous/anxious. The patient is not hyperactive.     Past Medical History:  Diagnosis Date  . Allergy   .  Anemia   . Anxiety   . Depression   . GERD (gastroesophageal reflux disease)   . History of kidney stones    episodes x1 -multiple stones- not a bother at this time.  . Hypertension    not on meds presently by MD action.  . Substance abuse [facility](HCC)    Past Surgical History:  Procedure Laterality Date  . COLONOSCOPY WITH PROPOFOL N/A 05/03/2015   Procedure: COLONOSCOPY WITH PROPOFOL;  Surgeon: Charolett BumpersMartin K Johnson, MD;  Location: [facility];  Service: Endoscopy;  Laterality: N/A;  . ESOPHAGOGASTRODUODENOSCOPY (EGD) WITH PROPOFOL N/A 05/03/2015   Procedure: ESOPHAGOGASTRODUODENOSCOPY (EGD) WITH PROPOFOL;  Surgeon: Charolett BumpersMartin K Johnson, MD;  Location: [facility];  Service: Endoscopy;  Laterality: N/A;   Allergies  Allergen Reactions  . Carafate [Sucralfate] Other (See Comments)    "Made sick on stomach"   Current Outpatient Medications on File Prior to Visit  Medication Sig Dispense Refill  . acetaminophen (TYLENOL) 500 MG tablet Take 1,000 mg by mouth every 6 (six) hours as needed (For headache.).    Marland Kitchen. benzonatate (TESSALON) 200 MG capsule Take 1 capsule (200 mg total) by mouth 2 (two) times daily as needed for cough. 20 capsule 0  . buPROPion (WELLBUTRIN) 100 MG tablet Take 100 mg by mouth 2 (two) times daily.    . cetirizine (ZYRTEC) 10 MG tablet Take 1 tablet (10 mg total) by mouth daily. 30 tablet 11  . Multiple Vitamin (MULTIVITAMIN WITH MINERALS) TABS tablet Take 1 tablet by mouth daily.    . pantoprazole (PROTONIX) 40 MG tablet Take 1 tablet (40 mg total) by mouth 2 (two) times daily before a meal. TAKE 1 TABLET(40 MG) BY MOUTH DAILY 180 tablet 1  . promethazine-codeine (PHENERGAN WITH CODEINE) 6.25-10 MG/5ML syrup Take 5 mLs by mouth at bedtime as needed for cough. 120 mL 0   No current facility-administered medications on file prior to visit.    Social History   Socioeconomic History  . Marital status: Single    Spouse name: Not on file  . Number of children: 0  . Years of  education: 4 years college  . Highest education level: Not on file  Occupational History  . Occupation: Maintenance crew  Social Needs  . Financial resource strain: Not on file  . Food insecurity:    Worry: Not on file    Inability: Not on file  . Transportation needs:    Medical: Not on file    Non-medical: Not on file  Tobacco Use  . Smoking status: Current Every Day Smoker    Packs/day: 1.00    Years: 20.00    Pack years: 20.00    Types: Cigarettes  . Smokeless tobacco: Never Used  . Tobacco comment: Opiate use   Substance and Sexual Activity  . Alcohol use: No    Alcohol/week: 0.0 oz    Comment: Quit  2015 also - attends AA.  . Drug use: Yes    Types: Marijuana, Other-see comments, Heroin    Comment: Quit 04-02-13 Attends AA  . Sexual activity: Not Currently  Lifestyle  . Physical activity:    Days per week: Not on file    Minutes per session: Not on file  . Stress: Not on file  Relationships  . Social connections:    Talks on phone: Not on file    Gets together: Not on file    Attends religious service: Not on file  Active member of club or organization: Not on file    Attends meetings of clubs or organizations: Not on file    Relationship status: Not on file  . Intimate partner violence:    Fear of current or ex partner: Not on file    Emotionally abused: Not on file    Physically abused: Not on file    Forced sexual activity: Not on file  Other Topics Concern  . Not on file  Social History Narrative   Marital status: single; not dating.  From [place], [place].       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      Tobacco:  1 ppd x 20 years.  Never quit.      Alcohol: in recovery; drinking 1/5 per day; duration 20 years; DWIs x 5; last DWI 09/2009.  Gets license back this month; four years without license.      Drugs:  Marijuana, prescription pills (pain medications Oxycontin, Vicodin, some benzos).  No  heroine or cocaine.      Exercise:  Bike riding during summer months; weights at home.      Sexual activity: none in 2018; last sexual activity ten years.      Lives at home alone.   Right-handed.   No daily caffeine use.   Family History  Problem Relation Age of Onset  . Hyperlipidemia Mother   . Hypertension Mother   . Hyperlipidemia Brother   . Hypertension Brother   . Cancer Maternal Grandmother   . Hyperlipidemia Maternal Grandmother   . Hypertension Maternal Grandmother   . Hyperlipidemia Maternal Grandfather   . Hypertension Maternal Grandfather   . Diabetes Maternal Grandfather   . Hyperlipidemia Paternal Grandmother   . Hypertension Paternal Grandmother   . Hyperlipidemia Paternal Grandfather   . Hypertension Paternal Grandfather   . Heart disease Paternal Grandfather        Objective:    BP 124/60 (BP Location: Left Arm, Patient Position: Sitting, Cuff Size: Normal)   Pulse 87   Temp 99.5 F (37.5 C) (Oral)   Ht 6' 0.44" (1.84 m)   Wt 147 lb 6.4 oz (66.9 kg)   SpO2 95%   BMI 19.75 kg/m  Physical Exam  Constitutional: He is oriented to person, place, and time. He appears well-developed and well-nourished. He appears ill. No distress.  HENT:  Head: Normocephalic and atraumatic.  Right Ear: External ear normal.  Left Ear: External ear normal.  Nose: Nose normal.  Mouth/Throat: Oropharynx is clear and moist.  Eyes: Pupils are equal, round, and reactive to light. Conjunctivae and EOM are normal.  Neck: Normal range of motion. Neck supple. Carotid bruit is not present. No thyromegaly present.  Cardiovascular: Normal rate, regular rhythm, normal heart sounds and intact distal pulses. Exam reveals no gallop and no friction rub.  No murmur heard. Pulmonary/Chest: Effort normal. No accessory muscle usage. No tachypnea. No respiratory distress. He has wheezes in the right lower field and the left lower field. He has no rhonchi. He has no rales.  Abdominal: Soft.  Bowel sounds are normal. He exhibits no distension and no mass. There is no tenderness. There is no rebound and no guarding.  Genitourinary: Penis normal.  Musculoskeletal:       Right shoulder: Normal.       Left shoulder: Normal.       Cervical back: Normal.  Lymphadenopathy:  He has no cervical adenopathy.  Neurological: He is alert and oriented to person, place, and time. He has normal reflexes. No cranial nerve deficit. He exhibits normal muscle tone. Coordination normal.  Skin: Skin is warm and dry. No rash noted. He is not diaphoretic.  Psychiatric: He has a normal mood and affect. His behavior is normal. Judgment and thought content normal.   No results found. Depression screen [facility] 2/9 03/20/2017 03/12/2017 12/22/2016 12/19/2016 12/19/2016  Decreased Interest 0 0 0 0 0  Down, Depressed, Hopeless 0 0 1 0 0  PHQ - 2 Score 0 0 1 0 0  Altered sleeping - - 3 - -  Tired, decreased energy - - 3 - -  Change in appetite - - 1 - -  Feeling bad or failure about yourself  - - 1 - -  Trouble concentrating - - 2 - -  Moving slowly or fidgety/restless - - 1 - -  Suicidal thoughts - - 0 - -  PHQ-9 Score - - 12 - -  Difficult doing work/chores - - Somewhat difficult - -   Fall Risk  03/20/2017 03/12/2017 12/22/2016 12/19/2016 12/19/2016  Falls in the past year? No No No No No        Assessment & Plan:   1. Postviral fatigue syndrome   2. Iron deficiency anemia due to chronic blood loss   3. Anxiety and depression   4. Cough   5. Smoker   6. Substance abuse in remission (HCC)   7. Acute non-recurrent maxillary sinusitis     Acute sinusitis: New onset; rx for Augmentin and Prednisone provided.    Iron deficiency anemia: recurrent; asymptomatic; no associated GI symptoms; history of gastric ulcer; patient to complete hemoccult cards; if anemia worsens, will warrant referral to GI.  Continue iron tablet daily after completing stool studies.  Does suffer with ongoing GERD: may have  esophagitis or gastritis.   Anxiety and depression with insomnia: improved; s/p psychiatry consultation; tolerating Wellbutrin. Intolerant to Seroquel.  Unintentional weight loss: persistent; close follow-up; if ongoing, obtain CT chest/abdomen/pelvis and refer back to GI.  Anxiety and depression may be contributing; now that emotionally improved, weight should improve if secondary to anxiety/depression.  Denies substance abuse.    Orders Placed This Encounter  Procedures  . CBC with Differential/Platelet  . Comprehensive metabolic panel    Order Specific Question:   Has the patient fasted?    Answer:   No  . Iron  . POC Hemoccult Bld/Stl (3-Cd Home Screen)    Standing Status:   Future    Number of Occurrences:   1    Standing Expiration Date:   03/21/2018   Meds ordered this encounter  Medications  . amoxicillin-clavulanate (AUGMENTIN) 875-125 MG tablet    Sig: Take 1 tablet by mouth 2 (two) times daily. X 5 days then 1 tablet daily x 5 days    Dispense:  15 tablet    Refill:  0  . predniSONE (DELTASONE) 20 MG tablet    Sig: Take 3 PO QAM x 1 day, 2 PO QAM x 5 days, 1 PO QAM x 5 days    Dispense:  18 tablet    Refill:  0    Return in about 3 months (around 06/20/2017) for follow-up chronic medical conditions.   Daizy Outen Paulita Fujita, M.D. Primary [facility] at [facility] previously [facility] [address] [place], [place]  [address] [phone] phone [phone] fax

## 2017-03-20 NOTE — Patient Instructions (Signed)
     IF you received an x-ray today, you will receive an invoice from [facility]. Please contact  Radiology at [phone] with questions or concerns regarding your invoice.   IF you received labwork today, you will receive an invoice from [facility]. Please contact [facility] at [phone] with questions or concerns regarding your invoice.   Our billing staff will not be able to assist you with questions regarding bills from these companies.  You will be contacted with the lab results as soon as they are available. The fastest way to get your results is to activate your My Chart account. Instructions are located on the last page of this paperwork. If you have not heard from us regarding the results in 2 weeks, please contact this office.     

## 2017-03-21 ENCOUNTER — Encounter: Payer: Self-pay | Admitting: Family Medicine

## 2017-03-21 LAB — CBC WITH DIFFERENTIAL/PLATELET
BASOS ABS: 0 10*3/uL (ref 0.0–0.2)
Basos: 0 %
EOS (ABSOLUTE): 0.2 10*3/uL (ref 0.0–0.4)
EOS: 3 %
Hematocrit: 29.5 % — ABNORMAL LOW (ref 37.5–51.0)
Hemoglobin: 9.1 g/dL — ABNORMAL LOW (ref 13.0–17.7)
IMMATURE GRANULOCYTES: 1 %
Immature Grans (Abs): 0.1 10*3/uL (ref 0.0–0.1)
LYMPHS ABS: 1.9 10*3/uL (ref 0.7–3.1)
Lymphs: 24 %
MCH: 26.8 pg (ref 26.6–33.0)
MCHC: 30.8 g/dL — AB (ref 31.5–35.7)
MCV: 87 fL (ref 79–97)
MONOCYTES: 8 %
Monocytes Absolute: 0.6 10*3/uL (ref 0.1–0.9)
NEUTROS PCT: 64 %
Neutrophils Absolute: 5.3 10*3/uL (ref 1.4–7.0)
Platelets: 492 10*3/uL — ABNORMAL HIGH (ref 150–379)
RBC: 3.39 x10E6/uL — AB (ref 4.14–5.80)
RDW: 21.4 % — AB (ref 12.3–15.4)
WBC: 8.2 10*3/uL (ref 3.4–10.8)

## 2017-03-21 LAB — COMPREHENSIVE METABOLIC PANEL
ALK PHOS: 39 IU/L (ref 39–117)
ALT: 9 IU/L (ref 0–44)
AST: 12 IU/L (ref 0–40)
Albumin/Globulin Ratio: 1.7 (ref 1.2–2.2)
Albumin: 4.2 g/dL (ref 3.5–5.5)
BUN/Creatinine Ratio: 17 (ref 9–20)
BUN: 23 mg/dL (ref 6–24)
Bilirubin Total: <0.2 mg/dL (ref 0.0–1.2)
CALCIUM: 9.1 mg/dL (ref 8.7–10.2)
CO2: 19 mmol/L — AB (ref 20–29)
CREATININE: 1.33 mg/dL — AB (ref 0.76–1.27)
Chloride: 109 mmol/L — ABNORMAL HIGH (ref 96–106)
GFR calc Af Amer: 73 mL/min/{1.73_m2} (ref >59)
GFR calc non Af Amer: 63 mL/min/{1.73_m2} (ref >59)
GLUCOSE: 102 mg/dL — AB (ref 65–99)
Globulin, Total: 2.5 g/dL (ref 1.5–4.5)
Potassium: 4.6 mmol/L (ref 3.5–5.2)
SODIUM: 144 mmol/L (ref 134–144)
Total Protein: 6.7 g/dL (ref 6.0–8.5)

## 2017-03-21 LAB — IRON: IRON: 21 ug/dL — AB (ref 38–169)

## 2017-03-24 ENCOUNTER — Encounter: Payer: Self-pay | Admitting: Family Medicine

## 2017-03-27 ENCOUNTER — Encounter: Payer: Self-pay | Admitting: Family Medicine

## 2017-04-02 LAB — POC HEMOCCULT BLD/STL (HOME/3-CARD/SCREEN)
FECAL OCCULT BLD: NEGATIVE
FECAL OCCULT BLD: NEGATIVE
Fecal Occult Blood, POC: NEGATIVE

## 2017-04-02 LAB — IFOBT (OCCULT BLOOD): IMMUNOLOGICAL FECAL OCCULT BLOOD TEST: NEGATIVE

## 2017-04-11 DIAGNOSIS — F331 Major depressive disorder, recurrent, moderate: Secondary | ICD-10-CM | POA: Diagnosis not present

## 2017-04-11 DIAGNOSIS — F411 Generalized anxiety disorder: Secondary | ICD-10-CM | POA: Diagnosis not present

## 2017-04-23 ENCOUNTER — Encounter: Payer: Self-pay | Admitting: Family Medicine

## 2017-04-29 DIAGNOSIS — F411 Generalized anxiety disorder: Secondary | ICD-10-CM | POA: Diagnosis not present

## 2017-04-29 DIAGNOSIS — F331 Major depressive disorder, recurrent, moderate: Secondary | ICD-10-CM | POA: Diagnosis not present

## 2017-05-08 ENCOUNTER — Other Ambulatory Visit: Payer: Self-pay | Admitting: Family Medicine

## 2017-05-09 NOTE — Telephone Encounter (Signed)
Prednisone 20 mg refill request  LOV 03/20/17 with Dr. Sheffield Slider [phone] [place], [place] - [phone]

## 2017-05-23 ENCOUNTER — Encounter: Payer: Self-pay | Admitting: Family Medicine

## 2017-06-06 ENCOUNTER — Other Ambulatory Visit: Payer: Self-pay | Admitting: Family Medicine

## 2017-06-06 NOTE — Telephone Encounter (Signed)
LOV 03-20-17 with Dr. Katrinka BlazingSmith / Refill request for protonix / Filled per protocol /

## 2017-06-10 ENCOUNTER — Emergency Department (HOSPITAL_COMMUNITY)
Admission: EM | Admit: 2017-06-10 | Discharge: 2017-06-11 | Disposition: A | Discharge status: Home / Self Care | Payer: BLUE CROSS/BLUE SHIELD | Attending: Physician Assistant | Admitting: Physician Assistant

## 2017-06-10 ENCOUNTER — Emergency Department (HOSPITAL_COMMUNITY): Payer: BLUE CROSS/BLUE SHIELD

## 2017-06-10 ENCOUNTER — Encounter (HOSPITAL_COMMUNITY): Payer: Self-pay | Admitting: Emergency Medicine

## 2017-06-10 DIAGNOSIS — R5383 Other fatigue: Secondary | ICD-10-CM | POA: Diagnosis not present

## 2017-06-10 DIAGNOSIS — R531 Weakness: Secondary | ICD-10-CM | POA: Diagnosis not present

## 2017-06-10 DIAGNOSIS — I10 Essential (primary) hypertension: Secondary | ICD-10-CM | POA: Insufficient documentation

## 2017-06-10 DIAGNOSIS — N200 Calculus of kidney: Secondary | ICD-10-CM | POA: Diagnosis not present

## 2017-06-10 DIAGNOSIS — F17210 Nicotine dependence, cigarettes, uncomplicated: Secondary | ICD-10-CM | POA: Diagnosis not present

## 2017-06-10 DIAGNOSIS — E876 Hypokalemia: Secondary | ICD-10-CM | POA: Diagnosis not present

## 2017-06-10 DIAGNOSIS — Z79899 Other long term (current) drug therapy: Secondary | ICD-10-CM | POA: Insufficient documentation

## 2017-06-10 DIAGNOSIS — R410 Disorientation, unspecified: Secondary | ICD-10-CM | POA: Diagnosis not present

## 2017-06-10 DIAGNOSIS — J189 Pneumonia, unspecified organism: Secondary | ICD-10-CM | POA: Insufficient documentation

## 2017-06-10 DIAGNOSIS — R1030 Lower abdominal pain, unspecified: Secondary | ICD-10-CM | POA: Diagnosis not present

## 2017-06-10 DIAGNOSIS — R457 State of emotional shock and stress, unspecified: Secondary | ICD-10-CM | POA: Diagnosis not present

## 2017-06-10 DIAGNOSIS — R079 Chest pain, unspecified: Secondary | ICD-10-CM | POA: Diagnosis not present

## 2017-06-10 LAB — URINALYSIS, ROUTINE W REFLEX MICROSCOPIC
BILIRUBIN URINE: NEGATIVE
Glucose, UA: NEGATIVE mg/dL
Ketones, ur: 5 mg/dL — AB
NITRITE: NEGATIVE
PH: 5 (ref 5.0–8.0)
Protein, ur: 100 mg/dL — AB
RBC / HPF: >50 RBC/hpf — ABNORMAL HIGH (ref 0–5)
SPECIFIC GRAVITY, URINE: 1.02 (ref 1.005–1.030)

## 2017-06-10 LAB — BASIC METABOLIC PANEL
Anion gap: 12 (ref 5–15)
BUN: 21 mg/dL — AB (ref 6–20)
CO2: 14 mmol/L — ABNORMAL LOW (ref 22–32)
CREATININE: 1.34 mg/dL — AB (ref 0.61–1.24)
Calcium: 8.7 mg/dL — ABNORMAL LOW (ref 8.9–10.3)
Chloride: 111 mmol/L (ref 101–111)
GFR calc Af Amer: >60 mL/min (ref >60)
GLUCOSE: 121 mg/dL — AB (ref 65–99)
Potassium: 2.9 mmol/L — ABNORMAL LOW (ref 3.5–5.1)
Sodium: 137 mmol/L (ref 135–145)

## 2017-06-10 LAB — RAPID URINE DRUG SCREEN, HOSP PERFORMED
AMPHETAMINES: NOT DETECTED
BENZODIAZEPINES: NOT DETECTED
Barbiturates: NOT DETECTED
Cocaine: NOT DETECTED
Opiates: NOT DETECTED
Tetrahydrocannabinol: NOT DETECTED

## 2017-06-10 LAB — CBC
HCT: 34.3 % — ABNORMAL LOW (ref 39.0–52.0)
Hemoglobin: 11.1 g/dL — ABNORMAL LOW (ref 13.0–17.0)
MCH: 29.5 pg (ref 26.0–34.0)
MCHC: 32.4 g/dL (ref 30.0–36.0)
MCV: 91.2 fL (ref 78.0–100.0)
PLATELETS: 235 10*3/uL (ref 150–400)
RBC: 3.76 MIL/uL — ABNORMAL LOW (ref 4.22–5.81)
RDW: 18.9 % — AB (ref 11.5–15.5)
WBC: 9.1 10*3/uL (ref 4.0–10.5)

## 2017-06-10 LAB — CBG MONITORING, ED: Glucose-Capillary: 98 mg/dL (ref 65–99)

## 2017-06-10 LAB — I-STAT CG4 LACTIC ACID, ED: LACTIC ACID, VENOUS: 0.37 mmol/L — AB (ref 0.5–1.9)

## 2017-06-10 LAB — ETHANOL

## 2017-06-10 MED ORDER — POTASSIUM CHLORIDE 10 MEQ/100ML IV SOLN
10.0000 meq | Freq: Once | INTRAVENOUS | Status: AC
  Administered 2017-06-10: 10 meq via INTRAVENOUS
  Filled 2017-06-10: qty 100

## 2017-06-10 MED ORDER — CEPHALEXIN 500 MG PO CAPS
500.0000 mg | ORAL_CAPSULE | Freq: Once | ORAL | Status: AC
  Administered 2017-06-11: 500 mg via ORAL
  Filled 2017-06-10: qty 1

## 2017-06-10 MED ORDER — AZITHROMYCIN 250 MG PO TABS
500.0000 mg | ORAL_TABLET | Freq: Once | ORAL | Status: AC
  Administered 2017-06-11: 500 mg via ORAL
  Filled 2017-06-10: qty 2

## 2017-06-10 MED ORDER — SODIUM CHLORIDE 0.9 % IV BOLUS
1000.0000 mL | Freq: Once | INTRAVENOUS | Status: AC
  Administered 2017-06-10: 1000 mL via INTRAVENOUS

## 2017-06-10 MED ORDER — POTASSIUM CHLORIDE CRYS ER 20 MEQ PO TBCR
40.0000 meq | EXTENDED_RELEASE_TABLET | Freq: Once | ORAL | Status: AC
  Administered 2017-06-11: 40 meq via ORAL
  Filled 2017-06-10: qty 2

## 2017-06-10 NOTE — Discharge Instructions (Addendum)
You were seen today for feelings of fatigue.  You are found to have a low potassium.  We recommend that you take oral 20 mEq of potassium daily for the next 4 days.  You will need to have your potassium rechecked.  In addition you were found to have some atypical "groundglass opacities" on your CAT scan in your lungs.  It is possible that these are from an atypical pneumonia.  We are treating you with azithromycin for this.  You will need a repeat CAT scan to make sure that these are improving so please follow-up with your primary care provider about this.  In addition your urine had a lot of red blood cells as well as white blood cells.  We are giving you a second  antibiotics to make sure that it covers the urinary pathogens.  You also have gallstones, no infection, that you should follow up with your PCP about.

## 2017-06-10 NOTE — ED Provider Notes (Signed)
[facility]-EMERGENCY DEPT Provider Note   CSN: [id number] Arrival date & time: 06/10/17  1825     History   Chief Complaint Chief Complaint  Patient presents with  . Weakness  . Fatigue    HPI Kyle Allen is a 47 y.o. male.  HPI  Patient is a 47 year old male with past medical history significant for anxiety depression, substance abuse in remission, alcohol abuse in remission, insomnia, weight loss.  He is presenting today with weakness and fatigue.  Patient reports is been at work and had a little bit of confusion.  He reports looking the clock and thinking it was 430 in time to go home and then he went home and took a nap and realized that it was actually 1230 when he had been looking at a clock. 6 he is alert and oriented x3 here.  He called his parents with concerns.  They called EMS to pick him up and bring him here to the emergency department.  He reports feeling more tired than usual,.  No dysuria, no fever.  No vomiting. Small amount of diarrhea.  Reports battling with weight loss, has been taking Ensure.  Had one episode of vomiting that was black in color 6 weeks ago.  Otherwise really nonfocal HPI. Past Medical History:  Diagnosis Date  . Allergy   . Anemia   . Anxiety   . Depression   . GERD (gastroesophageal reflux disease)   . History of kidney stones    episodes x1 -multiple stones- not a bother at this time.  . Hypertension    not on meds presently by MD action.  . Substance abuse [facility](HCC)     Patient Active Problem List   Diagnosis Date Noted  . Cough 03/12/2017  . Lower respiratory infection 03/12/2017  . Anxiety and depression 09/18/2013  . Insomnia 09/18/2013  . Smoker 09/18/2013  . Substance abuse in remission (HCC) 09/18/2013  . Herpes labialis 09/18/2013    Past Surgical History:  Procedure Laterality Date  . COLONOSCOPY WITH PROPOFOL N/A 05/03/2015   Procedure: COLONOSCOPY WITH PROPOFOL;  Surgeon: Charolett BumpersMartin K Johnson, MD;   Location: [facility];  Service: Endoscopy;  Laterality: N/A;  . ESOPHAGOGASTRODUODENOSCOPY (EGD) WITH PROPOFOL N/A 05/03/2015   Procedure: ESOPHAGOGASTRODUODENOSCOPY (EGD) WITH PROPOFOL;  Surgeon: Charolett BumpersMartin K Johnson, MD;  Location: [facility];  Service: Endoscopy;  Laterality: N/A;        Home Medications    Prior to Admission medications   Medication Sig Start Date End Date Taking? Authorizing Provider  acetaminophen (TYLENOL) 500 MG tablet Take 1,000 mg by mouth every 6 (six) hours as needed (For headache.).   Yes [provider]  buPROPion (WELLBUTRIN) 100 MG tablet Take 100 mg by mouth 2 (two) times daily.   Yes [provider]  Cyanocobalamin (VITAMIN B 12 PO) Take 1 tablet by mouth daily.   Yes [provider]  fluticasone (FLONASE) 50 MCG/ACT nasal spray Place 1 spray into both nostrils daily.   Yes [provider]  Multiple Vitamin (MULTIVITAMIN WITH MINERALS) TABS tablet Take 1 tablet by mouth daily.   Yes [provider]  omeprazole (PRILOSEC) 20 MG capsule TAKE 1 CAPSULE BY MOUTH TWICE DAILY BEFORE A MEAL 06/06/17  Yes Ethelda ChickSmith, Kristi M, MD  amoxicillin-clavulanate (AUGMENTIN) 875-125 MG tablet Take 1 tablet by mouth 2 (two) times daily. X 5 days then 1 tablet daily x 5 days Patient not taking: Reported on 06/10/2017 03/20/17   Ethelda ChickSmith, Kristi M, MD  benzonatate (TESSALON) 200 MG capsule Take 1 capsule (200 mg total) by mouth 2 (two) times daily as needed for cough. Patient not taking: Reported on 06/10/2017 03/12/17   Georgina Quint, MD  cetirizine (ZYRTEC) 10 MG tablet Take 1 tablet (10 mg total) by mouth daily. Patient not taking: Reported on 06/10/2017 04/05/16   Ethelda Chick, MD  pantoprazole (PROTONIX) 40 MG tablet Take 1 tablet (40 mg total) by mouth 2 (two) times daily before a meal. TAKE 1 TABLET(40 MG) BY MOUTH DAILY Patient not taking: Reported on 06/10/2017 12/22/16   Ethelda Chick, MD  predniSONE (DELTASONE) 20 MG tablet  Take 3 PO QAM x 1 day, 2 PO QAM x 5 days, 1 PO QAM x 5 days Patient not taking: Reported on 06/10/2017 03/20/17   Ethelda Chick, MD    Family History Family History  Problem Relation Age of Onset  . Hyperlipidemia Mother   . Hypertension Mother   . Hyperlipidemia Brother   . Hypertension Brother   . Cancer Maternal Grandmother   . Hyperlipidemia Maternal Grandmother   . Hypertension Maternal Grandmother   . Hyperlipidemia Maternal Grandfather   . Hypertension Maternal Grandfather   . Diabetes Maternal Grandfather   . Hyperlipidemia Paternal Grandmother   . Hypertension Paternal Grandmother   . Hyperlipidemia Paternal Grandfather   . Hypertension Paternal Grandfather   . Heart disease Paternal Grandfather     Social History Social History   Tobacco Use  . Smoking status: Current Every Day Smoker    Packs/day: 1.00    Years: 20.00    Pack years: 20.00    Types: Cigarettes  . Smokeless tobacco: Never Used  . Tobacco comment: Opiate use   Substance Use Topics  . Alcohol use: No    Alcohol/week: 0.0 oz    Comment: Quit  2015 also - attends AA.  . Drug use: Yes    Types: Marijuana, Other-see comments, Heroin    Comment: Quit 04-02-13 Attends AA     Allergies   Carafate [sucralfate]   Review of Systems Review of Systems  Constitutional: Positive for fatigue. Negative for activity change.  Respiratory: Negative for shortness of breath.   Cardiovascular: Negative for chest pain.  Gastrointestinal: Negative for abdominal pain.  Genitourinary: Positive for flank pain. Negative for dysuria.  All other systems reviewed and are negative.    Physical Exam Updated Vital Signs BP (!) 106/58 (BP Location: Right Arm)   Pulse 72   Temp 98 F (36.7 C) (Oral)   Resp 15   SpO2 97%   Physical Exam  Constitutional: He is oriented to person, place, and time. He appears well-nourished.  HENT:  Head: Normocephalic.  Eyes: Conjunctivae are normal. Right eye exhibits no  discharge. Left eye exhibits no discharge.  Cardiovascular: Normal rate and regular rhythm.  Pulmonary/Chest: Effort normal and breath sounds normal. No respiratory distress.  Abdominal: Soft. He exhibits no distension. There is no tenderness.  Musculoskeletal:  Bilateral CVA tenderness.  Neurological: He is oriented to person, place, and time.  No meningismus.  Skin: Skin is warm and dry. He is not diaphoretic.  Psychiatric: He has a normal mood and affect. His behavior is normal.     ED Treatments / Results  Labs (all labs ordered are listed, but only abnormal results are displayed) Labs Reviewed  BASIC METABOLIC PANEL - Abnormal; Notable for the following components:      Result Value   Potassium 2.9 (*)    CO2  14 (*)    Glucose, Bld 121 (*)    BUN 21 (*)    Creatinine, Ser 1.34 (*)    Calcium 8.7 (*)    All other components within normal limits  CBC - Abnormal; Notable for the following components:   RBC 3.76 (*)    Hemoglobin 11.1 (*)    HCT 34.3 (*)    RDW 18.9 (*)    All other components within normal limits  URINALYSIS, ROUTINE W REFLEX MICROSCOPIC - Abnormal; Notable for the following components:   Color, Urine AMBER (*)    APPearance CLOUDY (*)    Hgb urine dipstick LARGE (*)    Ketones, ur 5 (*)    Protein, ur 100 (*)    Leukocytes, UA TRACE (*)    RBC / HPF >50 (*)    Bacteria, UA RARE (*)    All other components within normal limits  I-STAT CG4 LACTIC ACID, ED - Abnormal; Notable for the following components:   Lactic Acid, Venous 0.37 (*)    All other components within normal limits  URINE CULTURE  RAPID URINE DRUG SCREEN, HOSP PERFORMED  ETHANOL  HIV ANTIBODY (ROUTINE TESTING)  CBG MONITORING, ED    EKG EKG Interpretation  Date/Time:  Monday June 10 2017 18:40:52 EDT Ventricular Rate:  83 PR Interval:    QRS Duration: 104 QT Interval:  357 QTC Calculation: 420 R Axis:   82 Text Interpretation:  Sinus rhythm RSR' in V1 or V2, probably normal  variant Probable left ventricular hypertrophy ST elev, probable normal early repol pattern wandering baseline noted.  Confirmed by Bary Castilla ([id number]) on 06/10/2017 9:40:37 PM   Radiology Ct Head Wo Contrast  Result Date: 06/10/2017 CLINICAL DATA:  Generalized weakness and fatigue since Friday. EXAM: CT HEAD WITHOUT CONTRAST TECHNIQUE: Contiguous axial images were obtained from the base of the skull through the vertex without intravenous contrast. COMPARISON:  06/29/2014 FINDINGS: BRAIN: Mild superficial atrophy characterized by sulcal prominence, slightly advanced for age. No intraparenchymal hemorrhage, mass effect nor midline shift. No acute large vascular territory infarcts. Grey-white matter distinction is maintained. The basal ganglia are unremarkable. No abnormal extra-axial fluid collections. Basal cisterns are not effaced and midline. The brainstem and cerebellar hemispheres are without acute abnormalities. VASCULAR: Unremarkable. SKULL/SOFT TISSUES: No skull fracture. No significant soft tissue swelling. ORBITS/SINUSES: The included ocular globes and orbital contents are normal.The mastoid air cells are clear. The included paranasal sinuses are well-aerated. OTHER: None. IMPRESSION: No acute intracranial abnormality. Mild superficial atrophy advanced for age. Electronically Signed   By: Tollie Eth M.D.   On: 06/10/2017 23:21   Ct Renal Stone Study  Result Date: 06/10/2017 CLINICAL DATA:  Generalized weakness and fatigue since Friday. Flank pain. EXAM: CT ABDOMEN AND PELVIS WITHOUT CONTRAST TECHNIQUE: Multidetector CT imaging of the abdomen and pelvis was performed following the standard protocol without IV contrast. COMPARISON:  None. FINDINGS: Lower chest: Ground-glass pulmonary opacities along the periphery of the included lung bases. Findings are nonspecific but can be seen in acute eosinophilic pneumonia though lack of pleural effusions make this less likely. Otherwise, an interstitial  pneumonia or interstitial pulmonary edema among some other possibilities. No pericardial effusion. Eosinophilic pneumonia given the peripheral distribution. Hepatobiliary: A 9 mm nonobstructing gallstone is noted within the gallbladder without secondary signs of acute cholecystitis. The unenhanced liver is unremarkable. Pancreas: Normal Spleen: Normal Adrenals/Urinary Tract: Normal bilateral adrenal glands. Punctate nonobstructing renal calculi, the largest measuring 4 mm in the lower pole of the left  kidney and 6 mm in the interpolar aspect of the right kidney. No hydroureteronephrosis. The urinary bladder is unremarkable for degree of distention. Stomach/Bowel: Physiologically distended stomach with normal small bowel rotation. No bowel obstruction or inflammation. Moderate stool retention within the colon without obstruction or inflammation. Normal appendix. Vascular/Lymphatic: Mild aortoiliac atherosclerosis.  No adenopathy. Reproductive: Central and peripheral zone calcifications identified of the normal size prostate. Seminal vesicles are unremarkable. Other: No abdominal wall hernia or abnormality. No abdominopelvic ascites. Musculoskeletal: No acute or significant osseous findings. IMPRESSION: 1. Ground-glass opacities at the lung bases bilaterally in a peripheral distribution. Findings are nonspecific but may be related to interstitial pneumonia though the possibility of acute eosinophilic pneumonia given its peripheral distribution is also raised. Stigmata of interstitial edema is also possibility. 2. Uncomplicated cholelithiasis. 3. Bilateral nephrolithiasis without obstructive uropathy. Electronically Signed   By: Tollie Eth M.D.   On: 06/10/2017 23:18    Procedures Procedures (including critical care time)  Medications Ordered in ED Medications  potassium chloride 10 mEq in 100 mL IVPB (10 mEq Intravenous New Bag/Given 06/10/17 2308)  potassium chloride SA (K-DUR,KLOR-CON) CR tablet 40 mEq (has  no administration in time range)  azithromycin (ZITHROMAX) tablet 500 mg (has no administration in time range)  cephALEXin (KEFLEX) capsule 500 mg (has no administration in time range)  sodium chloride 0.9 % bolus 1,000 mL (1,000 mLs Intravenous New Bag/Given 06/10/17 2308)     Initial Impression / Assessment and Plan / ED Course  I have reviewed the triage vital signs and the nursing notes.  Pertinent labs & imaging results that were available during my care of the patient were reviewed by me and considered in my medical decision making (see chart for details).     Patient is a 47 year old male with past medical history significant for anxiety depression, substance abuse in remission, alcohol abuse in remission, insomnia, weight loss.  He is presenting today with weakness and fatigue.  Patient reports is been at work and had a little bit of confusion.  He reports looking the clock and thinking it was 430 in time to go home and then he went home and took a nap and realized that it was actually 1230 when he had been looking at a clock. 6 he is alert and oriented x3 here.  He called his parents with concerns.  They called EMS to pick him up and bring him here to the emergency department.  He reports feeling more tired than usual,.  No dysuria, no fever.  No vomiting. Small amount of diarrhea.  Reports battling with weight loss, has been taking Ensure.  Had one episode of vomiting that was black in color 6 weeks ago.  Otherwise really nonfocal HPI.  10:19 PM Patient only has vague history of fatigue and mild confusion.  Patient's work-up is been consistent with hemoglobin in his urine.  Concern for infected stone.  Will get CT stone, CT head.  Otherwise patient has no white count, no fever vital signs are reassuring.   11:37 PM CT shows kidney stones without obstruction.  Patient was aware of theset.  In addition patient has groundglass opacities in the bilateral lung bases.  It is possible that  this represents an atypical pneumonia.  This would be consistent with his overall fatigue and not feeling well.  Will treat with azithromycin.  We will add on Keflex given the questionable urinary tract infection versus blood in the urine from chronic stones.    Discussed in detail  with patient's stepfather at bedside.  Patient's father is concerned because patient has been working very hard and then walking his dog and he is concerned that he is not eating properly.    Will have patient take oral potassium, the antibiotics, and follow-up with primary care physician in roughly 48 hours.  Patient may require additional outpatient imaging CAT scan of his chest  once course of antibiotics has been completed.  HIV sent.  Patient with normal vitals, taking PO at time of dischrage.  Final Clinical Impressions(s) / ED Diagnoses   Final diagnoses:  None    ED Discharge Orders    None       Kaylaann Mountz, Cindee Salt, MD 06/11/17 0009

## 2017-06-10 NOTE — ED Triage Notes (Addendum)
Patient here from home via EMS with complaints of generalized weakness and fatigue since Friday. Denies pain. "Stress and anxiety, I'm overworked" Cbg 118.

## 2017-06-11 MED ORDER — CEPHALEXIN 500 MG PO CAPS: 500.0000 mg | ORAL_CAPSULE | Freq: Four times a day (QID) | ORAL | 0 refills | Status: DC

## 2017-06-11 MED ORDER — POTASSIUM CHLORIDE CRYS ER 20 MEQ PO TBCR: 20.0000 meq | EXTENDED_RELEASE_TABLET | Freq: Every day | ORAL | 0 refills | Status: DC

## 2017-06-11 MED ORDER — AZITHROMYCIN 250 MG PO TABS: 250.0000 mg | ORAL_TABLET | Freq: Once | ORAL | 0 refills | Status: AC

## 2017-06-12 LAB — URINE CULTURE: CULTURE: NO GROWTH

## 2017-06-12 LAB — HIV ANTIBODY (ROUTINE TESTING W REFLEX): HIV Screen 4th Generation wRfx: NONREACTIVE

## 2017-06-13 ENCOUNTER — Telehealth: Payer: Self-pay | Admitting: Family Medicine

## 2017-06-13 ENCOUNTER — Encounter: Payer: Self-pay | Admitting: Family Medicine

## 2017-06-13 ENCOUNTER — Ambulatory Visit: Payer: BLUE CROSS/BLUE SHIELD | Admitting: Family Medicine

## 2017-06-13 ENCOUNTER — Ambulatory Visit (INDEPENDENT_AMBULATORY_CARE_PROVIDER_SITE_OTHER): Payer: BLUE CROSS/BLUE SHIELD

## 2017-06-13 ENCOUNTER — Other Ambulatory Visit: Payer: Self-pay

## 2017-06-13 VITALS — BP 100/54 | HR 67 | Temp 98.5°F | Ht 72.0 in | Wt 142.0 lb

## 2017-06-13 DIAGNOSIS — D509 Iron deficiency anemia, unspecified: Secondary | ICD-10-CM | POA: Diagnosis not present

## 2017-06-13 DIAGNOSIS — R634 Abnormal weight loss: Secondary | ICD-10-CM | POA: Diagnosis not present

## 2017-06-13 DIAGNOSIS — R1084 Generalized abdominal pain: Secondary | ICD-10-CM

## 2017-06-13 DIAGNOSIS — Z8711 Personal history of peptic ulcer disease: Secondary | ICD-10-CM

## 2017-06-13 DIAGNOSIS — Z8719 Personal history of other diseases of the digestive system: Secondary | ICD-10-CM

## 2017-06-13 DIAGNOSIS — E876 Hypokalemia: Secondary | ICD-10-CM

## 2017-06-13 DIAGNOSIS — K8020 Calculus of gallbladder without cholecystitis without obstruction: Secondary | ICD-10-CM | POA: Diagnosis not present

## 2017-06-13 DIAGNOSIS — R112 Nausea with vomiting, unspecified: Secondary | ICD-10-CM | POA: Diagnosis not present

## 2017-06-13 DIAGNOSIS — R918 Other nonspecific abnormal finding of lung field: Secondary | ICD-10-CM | POA: Diagnosis not present

## 2017-06-13 DIAGNOSIS — R5383 Other fatigue: Secondary | ICD-10-CM | POA: Diagnosis not present

## 2017-06-13 DIAGNOSIS — J189 Pneumonia, unspecified organism: Secondary | ICD-10-CM | POA: Diagnosis not present

## 2017-06-13 LAB — POCT CBC
GRANULOCYTE PERCENT: 80.6 % — AB (ref 37–80)
HEMATOCRIT: 37.2 % — AB (ref 43.5–53.7)
Hemoglobin: 11.7 g/dL — AB (ref 14.1–18.1)
Lymph, poc: 1 (ref 0.6–3.4)
MCH: 28.8 pg (ref 27–31.2)
MCHC: 31.6 g/dL — AB (ref 31.8–35.4)
MCV: 91 fL (ref 80–97)
MID (cbc): 0.2 (ref 0–0.9)
MPV: 5.8 fL (ref 0–99.8)
POC GRANULOCYTE: 5.2 (ref 2–6.9)
POC LYMPH %: 16 % (ref 10–50)
POC MID %: 3.4 %M (ref 0–12)
Platelet Count, POC: 351 10*3/uL (ref 142–424)
RBC: 4.08 M/uL — AB (ref 4.69–6.13)
RDW, POC: 20.2 %
WBC: 6.5 10*3/uL (ref 4.6–10.2)

## 2017-06-13 LAB — BASIC METABOLIC PANEL
BUN/Creatinine Ratio: 14 (ref 9–20)
BUN: 13 mg/dL (ref 6–24)
CHLORIDE: 106 mmol/L (ref 96–106)
CO2: 25 mmol/L (ref 20–29)
CREATININE: 0.94 mg/dL (ref 0.76–1.27)
Calcium: 10.1 mg/dL (ref 8.7–10.2)
GFR calc non Af Amer: 96 mL/min/{1.73_m2} (ref >59)
GFR, EST AFRICAN AMERICAN: 111 mL/min/{1.73_m2} (ref >59)
Glucose: 105 mg/dL — ABNORMAL HIGH (ref 65–99)
Potassium: 4.1 mmol/L (ref 3.5–5.2)
Sodium: 141 mmol/L (ref 134–144)

## 2017-06-13 LAB — GLUCOSE, POCT (MANUAL RESULT ENTRY): POC Glucose: 102 mg/dl — AB (ref 70–99)

## 2017-06-13 LAB — LIPASE: Lipase: 28 U/L (ref 13–78)

## 2017-06-13 MED ORDER — ONDANSETRON 4 MG PO TBDP
4.0000 mg | ORAL_TABLET | Freq: Three times a day (TID) | ORAL | 0 refills | Status: DC | PRN
Start: 2017-06-13 — End: 2017-07-01

## 2017-06-13 NOTE — Telephone Encounter (Signed)
Kyle Allen. [name] called with BMET and Lipase labs collected, she says the labs will be faxed to [phone].

## 2017-06-13 NOTE — Telephone Encounter (Signed)
Printed labs and gave to Dr. Neva SeatGreene

## 2017-06-13 NOTE — Patient Instructions (Addendum)
Vomiting may be from coming off your heartburn medicine. Ok to take zofran if needed for nausea, try to take your usual meds again after the zofran has started. As you have not received IV fluid and feel better at this time, can try small sips of fluids then small amounts of bland foods if fluids are tolerated. Water is best.   I will let you know the other results later today, and plan for follow up in 48 hours. If worse overnight go to the emergency room. I am also here tomorrow if you need to be seen.   Ok to stop cephalexin as no sign of infection on urine culture. Blood in urine likely from kidney stones, but please discuss other workup with your primary provider at follow up July 1st.   Return to the clinic or go to the nearest emergency room if any of your symptoms worsen or new symptoms occur.   Nausea and Vomiting, Adult Nausea is the feeling that you have an upset stomach or have to vomit. As nausea gets worse, it can lead to vomiting. Vomiting occurs when stomach contents are thrown up and out of the mouth. Vomiting can make you feel weak and cause you to become dehydrated. Dehydration can make you tired and thirsty, cause you to have a dry mouth, and decrease how often you urinate. Older adults and people with other diseases or a weak immune system are at higher risk for dehydration. It is important to treat your nausea and vomiting as told by your health care provider. Follow these instructions at home: Follow instructions from your health care provider about how to care for yourself at home. Eating and drinking Follow these recommendations as told by your health care provider:  Take an oral rehydration solution (ORS). This is a drink that is sold at pharmacies and retail stores.  Drink clear fluids in small amounts as you are able. Clear fluids include water, ice chips, diluted fruit juice, and low-calorie sports drinks.  Eat bland, easy-to-digest foods in small amounts as you are  able. These foods include bananas, applesauce, rice, lean meats, toast, and crackers.  Avoid fluids that contain a lot of sugar or caffeine, such as energy drinks, sports drinks, and soda.  Avoid alcohol.  Avoid spicy or fatty foods.  General instructions  Drink enough fluid to keep your urine clear or pale yellow.  Wash your hands often. If soap and water are not available, use hand sanitizer.  Make sure that all people in your household wash their hands well and often.  Take over-the-counter and prescription medicines only as told by your health care provider.  Rest at home while you recover.  Watch your condition for any changes.  Breathe slowly and deeply when you feel nauseated.  Keep all follow-up visits as told by your health care provider. This is important. Contact a health care provider if:  You have a fever.  You cannot keep fluids down.  Your symptoms get worse.  You have new symptoms.  Your nausea does not go away after two days.  You feel light-headed or dizzy.  You have a headache.  You have muscle cramps. Get help right away if:  You have pain in your chest, neck, arm, or jaw.  You feel extremely weak or you faint.  You have persistent vomiting.  You see blood in your vomit.  Your vomit looks like black coffee grounds.  You have bloody or black stools or stools that look like  tar.  You have a severe headache, a stiff neck, or both.  You have a rash.  You have severe pain, cramping, or bloating in your abdomen.  You have trouble breathing or you are breathing very quickly.  Your heart is beating very quickly.  Your skin feels cold and clammy.  You feel confused.  You have pain when you urinate.  You have signs of dehydration, such as: ? Dark urine, very little urine, or no urine. ? Cracked lips. ? Dry mouth. ? Sunken eyes. ? Sleepiness. ? Weakness. These symptoms may represent a serious problem that is an emergency. Do not  wait to see if the symptoms will go away. Get medical help right away. Call your local emergency services (911 in the [place] Do not drive yourself to the hospital. This information is not intended to replace advice given to you by your health care provider. Make sure you discuss any questions you have with your health care provider. Document Released: 12/18/2004 Document Revised: 05/23/2015 Document Reviewed: 08/24/2014 Elsevier Interactive Patient Education  2018 [employer].     IF you received an x-ray today, you will receive an invoice from [facility]. Please contact [facility] at [phone] with questions or concerns regarding your invoice.   IF you received labwork today, you will receive an invoice from [facility]. Please contact [facility] at [phone] with questions or concerns regarding your invoice.   Our billing staff will not be able to assist you with questions regarding bills from these companies.  You will be contacted with the lab results as soon as they are available. The fastest way to get your results is to activate your My Chart account. Instructions are located on the last page of this paperwork. If you have not heard from [place] regarding the results in 2 weeks, please contact this office.

## 2017-06-13 NOTE — Progress Notes (Addendum)
Subjective:  By signing my name below, I, [name], attest that this documentation has been prepared under the direction and in the presence of Shade Flood, MD Electronically Signed: Charline Bills, ED Scribe 06/13/2017 at 10:13 AM.   Patient ID: Kyle Allen, male    DOB: 02-12-1970, 47 y.o.   MRN: [id number]  Chief Complaint  Patient presents with  . Hospitalization Follow-up    Atypical pneumonia follow up   . Emesis    vomits after drinking things. last night he could  not keep anything down   HPI Kyle Allen is a 47 y.o. male who presents to Primary Care at [facility] for ER f/u. Seen at [facility] ER 6/10-11 after some confusion at work. Reported issues with weight loss and taking Ensure, 1 episode of vomiting 6 wks prior. Afebrile in ER but hypokalemic with K 2.9. Creatinine elevated at 1.34. Mildly anemic with hgb 11.1. Hematuria noted on UA. Non-reactive HIV, neg ethanol, neg UDS. Ultimately urine culture has been no growth. Head CT without acute abnormality. Mild superficial atrophy advanced for age. CT renal stone study indicating ground-glass opacities bilateral lung bases. Possible pneumonia vs interstitial edema. Noted to have uncomplicated cholelithiasis.and bilateral nephrolithiasis without obstructed urology. Treated with Zithromax for possible atypical pneumonia and keflex for findings on UA. Treated with oral K supplement. H/o substance abuse (alcohol). Anxiety and depression discussed with PCP Dr. Katrinka Blazing in March. Has been followed by psychiatry and therapist in the past.  Pt stopped omeprazole 2 days ago. Woke with horrible heartburn. Pt reports 3 episodes of emesis last night, some emesis around 9 AM this morning about 20 minutes after taking his meds. Reports brown colored vomit but states he did eat spaghetti the night prior. States he was able to drink water and Gatorade yesterday but is unable to keep that down today. He aslo reports some fatigue, lightheadedness,  generalized abdominal pain last night prior to vomiting which has improved some. Reports dark colored stools prior to ER visit last wk but has not had a BM since. He started Miralax this morning as well but has not been able to keep this down either. Denies cough, confusion, hallucinations, recent alcohol use, NSAID use. Last alcoholic beverage was over 4 yrs ago; went to [facility] for treatment.  Wt Readings from Last 3 Encounters:  06/13/17 142 lb (64.4 kg)  03/20/17 147 lb 6.4 oz (66.9 kg)  03/12/17 149 lb (67.6 kg)   Anemia Recurrent iron deficiency anemia with h/o gastric ulcer and GERD. Endoscopy 05/2015 with Dr. Laural Benes. Non-bleeding gastric ulcers noted at that time. - Pt stopped taking iron supplement ~2 wks ago due to constipation.  Patient Active Problem List   Diagnosis Date Noted  . Cough 03/12/2017  . Lower respiratory infection 03/12/2017  . Anxiety and depression 09/18/2013  . Insomnia 09/18/2013  . Smoker 09/18/2013  . Substance abuse in remission (HCC) 09/18/2013  . Herpes labialis 09/18/2013   Past Medical History:  Diagnosis Date  . Allergy   . Anemia   . Anxiety   . Depression   . GERD (gastroesophageal reflux disease)   . History of kidney stones    episodes x1 -multiple stones- not a bother at this time.  . Hypertension    not on meds presently by MD action.  . Substance abuse [facility])    Past Surgical History:  Procedure Laterality Date  . COLONOSCOPY WITH PROPOFOL N/A 05/03/2015   Procedure: COLONOSCOPY WITH PROPOFOL;  Surgeon: Charolett Bumpers,  MD;  Location: [facility];  Service: Endoscopy;  Laterality: N/A;  . ESOPHAGOGASTRODUODENOSCOPY (EGD) WITH PROPOFOL N/A 05/03/2015   Procedure: ESOPHAGOGASTRODUODENOSCOPY (EGD) WITH PROPOFOL;  Surgeon: Charolett Bumpers, MD;  Location: [facility];  Service: Endoscopy;  Laterality: N/A;   Allergies  Allergen Reactions  . Carafate [Sucralfate] Other (See Comments)    "Made sick on stomach"   Prior to  Admission medications   Medication Sig Start Date End Date Taking? Authorizing Provider  acetaminophen (TYLENOL) 500 MG tablet Take 1,000 mg by mouth every 6 (six) hours as needed (For headache.).    [provider]  amoxicillin-clavulanate (AUGMENTIN) 875-125 MG tablet Take 1 tablet by mouth 2 (two) times daily. X 5 days then 1 tablet daily x 5 days Patient not taking: Reported on 06/10/2017 03/20/17   Ethelda Chick, MD  benzonatate (TESSALON) 200 MG capsule Take 1 capsule (200 mg total) by mouth 2 (two) times daily as needed for cough. Patient not taking: Reported on 06/10/2017 03/12/17   Georgina Quint, MD  buPROPion [facility]) 100 MG tablet Take 100 mg by mouth 2 (two) times daily.    [provider]  cephALEXin (KEFLEX) 500 MG capsule Take 1 capsule (500 mg total) by mouth 4 (four) times daily for 5 days. 06/11/17 06/16/17  Mackuen, Courteney Lyn, MD  cetirizine (ZYRTEC) 10 MG tablet Take 1 tablet (10 mg total) by mouth daily. Patient not taking: Reported on 06/10/2017 04/05/16   Ethelda Chick, MD  Cyanocobalamin (VITAMIN B 12 PO) Take 1 tablet by mouth daily.    [provider]  fluticasone (FLONASE) 50 MCG/ACT nasal spray Place 1 spray into both nostrils daily.    [provider]  Multiple Vitamin (MULTIVITAMIN WITH MINERALS) TABS tablet Take 1 tablet by mouth daily.    [provider]  omeprazole (PRILOSEC) 20 MG capsule TAKE 1 CAPSULE BY MOUTH TWICE DAILY BEFORE A MEAL 06/06/17   Ethelda Chick, MD  pantoprazole (PROTONIX) 40 MG tablet Take 1 tablet (40 mg total) by mouth 2 (two) times daily before a meal. TAKE 1 TABLET(40 MG) BY MOUTH DAILY Patient not taking: Reported on 06/10/2017 12/22/16   Ethelda Chick, MD  potassium chloride SA (K-DUR,KLOR-CON) 20 MEQ tablet Take 1 tablet (20 mEq total) by mouth daily for 5 days. 06/11/17 06/16/17  Mackuen, Courteney Lyn, MD  predniSONE (DELTASONE) 20 MG tablet Take 3 PO QAM x 1 day, 2 PO QAM x 5 days,  1 PO QAM x 5 days Patient not taking: Reported on 06/10/2017 03/20/17   Ethelda Chick, MD   Social History   Socioeconomic History  . Marital status: Single    Spouse name: Not on file  . Number of children: 0  . Years of education: 4 years college  . Highest education level: Not on file  Occupational History  . Occupation: Maintenance crew  Social Needs  . Financial resource strain: Not on file  . Food insecurity:    Worry: Not on file    Inability: Not on file  . Transportation needs:    Medical: Not on file    Non-medical: Not on file  Tobacco Use  . Smoking status: Current Every Day Smoker    Packs/day: 1.00    Years: 20.00    Pack years: 20.00    Types: Cigarettes  . Smokeless tobacco: Never Used  . Tobacco comment: Opiate use   Substance and Sexual Activity  . Alcohol use: No    Alcohol/week:  0.0 oz    Comment: Quit  2015 also - attends AA.  . Drug use: Yes    Types: Marijuana, Other-see comments, Heroin    Comment: Quit 04-02-13 Attends AA  . Sexual activity: Not Currently  Lifestyle  . Physical activity:    Days per week: Not on file    Minutes per session: Not on file  . Stress: Not on file  Relationships  . Social connections:    Talks on phone: Not on file    Gets together: Not on file    Attends religious service: Not on file    Active member of club or organization: Not on file    Attends meetings of clubs or organizations: Not on file    Relationship status: Not on file  . Intimate partner violence:    Fear of current or ex partner: Not on file    Emotionally abused: Not on file    Physically abused: Not on file    Forced sexual activity: Not on file  Other Topics Concern  . Not on file  Social History Narrative   Marital status: single; not dating.  From [place], [place].       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      Tobacco:  1 ppd x 20 years.  Never quit.      Alcohol: in  recovery; drinking 1/5 per day; duration 20 years; DWIs x 5; last DWI 09/2009.  Gets license back this month; four years without license.      Drugs:  Marijuana, prescription pills (pain medications Oxycontin, Vicodin, some benzos).  No heroine or cocaine.      Exercise:  Bike riding during summer months; weights at home.      Sexual activity: none in 2018; last sexual activity ten years.      Lives at home alone.   Right-handed.   No daily caffeine use.   Review of Systems  Constitutional: Positive for fatigue.  Respiratory: Negative for cough.   Gastrointestinal: Positive for abdominal pain (improved) and vomiting.  Neurological: Positive for light-headedness.  Psychiatric/Behavioral: Negative for confusion and hallucinations.      Objective:   Physical Exam  Constitutional: He is oriented to person, place, and time. He appears well-developed and well-nourished. No distress.  HENT:  Head: Normocephalic and atraumatic.  Eyes: Conjunctivae and EOM are normal.  Neck: Neck supple. No tracheal deviation present.  Cardiovascular: Normal rate.  Pulmonary/Chest: Effort normal. No respiratory distress.  Abdominal: There is tenderness in the right lower quadrant and epigastric area. There is negative Murphy's sign.  Musculoskeletal: Normal range of motion.  Neurological: He is alert and oriented to person, place, and time.  Skin: Skin is warm and dry.  Psychiatric: He has a normal mood and affect. His behavior is normal.  Nursing note and vitals reviewed.  Vitals:   06/13/17 0945 06/13/17 0950  BP: (!) 96/50 (!) 100/54  Pulse: 67   Temp: 98.5 F (36.9 C)   TempSrc: Oral   SpO2: 98%   Weight: 142 lb (64.4 kg)   Height: 6' (1.829 m)    Results for orders placed or performed in visit on 06/13/17  POCT CBC  Result Value Ref Range   WBC 6.5 4.6 - 10.2 K/uL   Lymph, poc 1.0 0.6 - 3.4   POC LYMPH PERCENT 16.0 10 - 50 %L   MID (cbc) 0.2  0 - 0.9   POC MID % 3.4 0 - 12 %M   POC  Granulocyte 5.2 2 - 6.9   Granulocyte percent 80.6 (A) 37 - 80 %G   RBC 4.08 (A) 4.69 - 6.13 M/uL   Hemoglobin 11.7 (A) 14.1 - 18.1 g/dL   HCT, POC 16.1 (A) 09.6 - 53.7 %   MCV 91.0 80 - 97 fL   MCH, POC 28.8 27 - 31.2 pg   MCHC 31.6 (A) 31.8 - 35.4 g/dL   RDW, POC 04.5 %   Platelet Count, POC 351 142 - 424 K/uL   MPV 5.8 0 - 99.8 fL  POCT glucose (manual entry)  Result Value Ref Range   POC Glucose 102 (A) 70 - 99 mg/dl   Dg Abd Acute W/chest  Result Date: 06/13/2017 CLINICAL DATA:  Abdominal pain.  Emesis.  History of gastric ulcer. EXAM: DG ABDOMEN ACUTE W/ 1V CHEST COMPARISON:  06/10/2017. FINDINGS: Mediastinum hilar structures normal. Lungs are clear. Interim resolution of bibasilar infiltrates. No pleural effusion or pneumothorax. Cardiomegaly with normal pulmonary vascularity. Calcific density noted right upper quadrant consistent with known gallstone again noted. Known small bilateral renal calyceal stones again noted. Pelvic calcifications again noted consistent with phleboliths/vascular calcifications and prostate calcifications. No bowel distention. Stool noted throughout the colon. No acute bony abnormality. IMPRESSION: 1.  Interim resolution of bibasilar infiltrates. 2. Gallstone and bilateral small renal calyceal stones again noted. No acute intra-abdominal abnormality identified. No bowel distention. Electronically Signed   By: Maisie Fus  Register   On: 06/13/2017 10:47      Assessment & Plan:    Kyle Allen is a 47 y.o. male Generalized abdominal pain - Plan: DG Abd Acute W/Chest, Lipase, Ambulatory referral to Gastroenterology Nausea and vomiting, intractability of vomiting not specified, unspecified vomiting type - Plan: DG Abd Acute W/Chest, Basic metabolic panel, Lipase, Ambulatory referral to Gastroenterology, ondansetron (ZOFRAN ODT) 4 MG disintegrating tablet History of gastric ulcer - Plan: Ambulatory referral to Gastroenterology Fatigue, unspecified type - Plan:  POCT CBC, POCT glucose (manual entry), Basic metabolic panel, Orthostatic vital signs  -History of gastric ulcer, but hemoglobin improved from previous reading on June 10.  Suspect nausea/vomiting may be related to stopping PPI.  Slight volume depletion, offered IV fluids, but he would like to try oral rehydration therapy at home.  -Zofran prescribed, oral rehydration therapy with ER precautions given.  Follow-up with gastroenterology to determine if other imaging/endoscopy needed.  ER precautions if acute worsening.  We will plan on follow-up in 48 hours with other provider.  -Stop Keflex as urine culture without signs of infection  Hypokalemia - Plan: Basic metabolic panel  -Repeat BMP, hydration as above  Pneumonia of both lungs due to infectious organism, unspecified part of lung  -Improving with resolution of infiltrates on chest x-ray.  Finish azithromycin.  Iron deficiency anemia, unspecified iron deficiency anemia type - Plan: POCT CBC  -CBC as above  Loss of weight - Plan: Ambulatory referral to Gastroenterology  -May partly be related to recent illness.  Plan on eval with gastroenterology.  Did have blood in urinalysis, but History of kidney stones.  Recommended to continue follow-up with primary care provider as planned on July 1st to determine further work-up.  Meds ordered this encounter  Medications  . ondansetron (ZOFRAN ODT) 4 MG disintegrating tablet    Sig: Take 1 tablet (4 mg total) by mouth every 8 (eight) hours as needed for nausea or vomiting.    Dispense:  10  tablet    Refill:  0   Patient Instructions   Vomiting may be from coming off your heartburn medicine. Ok to take zofran if needed for nausea, try to take your usual meds again after the zofran has started. As you have not received IV fluid and feel better at this time, can try small sips of fluids then small amounts of bland foods if fluids are tolerated. Water is best.   I will let you know the other results  later today, and plan for follow up in 48 hours. If worse overnight go to the emergency room. I am also here tomorrow if you need to be seen.   Ok to stop cephalexin as no sign of infection on urine culture. Blood in urine likely from kidney stones, but please discuss other workup with your primary provider at follow up July 1st.   Return to the clinic or go to the nearest emergency room if any of your symptoms worsen or new symptoms occur.   Nausea and Vomiting, Adult Nausea is the feeling that you have an upset stomach or have to vomit. As nausea gets worse, it can lead to vomiting. Vomiting occurs when stomach contents are thrown up and out of the mouth. Vomiting can make you feel weak and cause you to become dehydrated. Dehydration can make you tired and thirsty, cause you to have a dry mouth, and decrease how often you urinate. Older adults and people with other diseases or a weak immune system are at higher risk for dehydration. It is important to treat your nausea and vomiting as told by your health care provider. Follow these instructions at home: Follow instructions from your health care provider about how to care for yourself at home. Eating and drinking Follow these recommendations as told by your health care provider:  Take an oral rehydration solution (ORS). This is a drink that is sold at pharmacies and retail stores.  Drink clear fluids in small amounts as you are able. Clear fluids include water, ice chips, diluted fruit juice, and low-calorie sports drinks.  Eat bland, easy-to-digest foods in small amounts as you are able. These foods include bananas, applesauce, rice, lean meats, toast, and crackers.  Avoid fluids that contain a lot of sugar or caffeine, such as energy drinks, sports drinks, and soda.  Avoid alcohol.  Avoid spicy or fatty foods.  General instructions  Drink enough fluid to keep your urine clear or pale yellow.  Wash your hands often. If soap and water  are not available, use hand sanitizer.  Make sure that all people in your household wash their hands well and often.  Take over-the-counter and prescription medicines only as told by your health care provider.  Rest at home while you recover.  Watch your condition for any changes.  Breathe slowly and deeply when you feel nauseated.  Keep all follow-up visits as told by your health care provider. This is important. Contact a health care provider if:  You have a fever.  You cannot keep fluids down.  Your symptoms get worse.  You have new symptoms.  Your nausea does not go away after two days.  You feel light-headed or dizzy.  You have a headache.  You have muscle cramps. Get help right away if:  You have pain in your chest, neck, arm, or jaw.  You feel extremely weak or you faint.  You have persistent vomiting.  You see blood in your vomit.  Your vomit looks like black  coffee grounds.  You have bloody or black stools or stools that look like tar.  You have a severe headache, a stiff neck, or both.  You have a rash.  You have severe pain, cramping, or bloating in your abdomen.  You have trouble breathing or you are breathing very quickly.  Your heart is beating very quickly.  Your skin feels cold and clammy.  You feel confused.  You have pain when you urinate.  You have signs of dehydration, such as: ? Dark urine, very little urine, or no urine. ? Cracked lips. ? Dry mouth. ? Sunken eyes. ? Sleepiness. ? Weakness. These symptoms may represent a serious problem that is an emergency. Do not wait to see if the symptoms will go away. Get medical help right away. Call your local emergency services (911 in the [place] Do not drive yourself to the hospital. This information is not intended to replace advice given to you by your health care provider. Make sure you discuss any questions you have with your health care provider. Document Released: 12/18/2004  Document Revised: 05/23/2015 Document Reviewed: 08/24/2014 Elsevier Interactive Patient Education  2018 [employer]     IF you received an x-ray today, you will receive an invoice from [facility]. Please contact [facility] at [phone] with questions or concerns regarding your invoice.   IF you received labwork today, you will receive an invoice from [facility]. Please contact [facility] at [phone] with questions or concerns regarding your invoice.   Our billing staff will not be able to assist you with questions regarding bills from these companies.  You will be contacted with the lab results as soon as they are available. The fastest way to get your results is to activate your My Chart account. Instructions are located on the last page of this paperwork. If you have not heard from us regarding the results in 2 weeks, please contact this office.   I personally performed the services described in this documentation, which was scribed in my presence. The recorded information has been reviewed and considered for accuracy and completeness, addended by me as needed, and agree with information above.  Signed,   Meredith StaggersJeffrey Addi Pak, MD Primary Care at [facility].  06/16/17 5:25 PM

## 2017-06-14 ENCOUNTER — Telehealth: Payer: Self-pay | Admitting: Family Medicine

## 2017-06-14 NOTE — Telephone Encounter (Signed)
Mother is concerned about Kyle Allen because his health is declining and she states she doesn't think that everything that is being said at the doctors is getting through to him. As well as she thinks he is getting his medications mixed up. She is over all of his health/HIPAA information. She states she doesn't want Rolm Galarik to know about her concerns about him. Her telephone number on file is correct.

## 2017-06-15 ENCOUNTER — Other Ambulatory Visit: Payer: Self-pay

## 2017-06-15 ENCOUNTER — Ambulatory Visit (INDEPENDENT_AMBULATORY_CARE_PROVIDER_SITE_OTHER): Payer: BLUE CROSS/BLUE SHIELD | Admitting: Physician Assistant

## 2017-06-15 ENCOUNTER — Encounter: Payer: Self-pay | Admitting: Physician Assistant

## 2017-06-15 VITALS — BP 114/71 | HR 53 | Temp 99.1°F | Resp 18 | Ht 72.0 in | Wt 141.0 lb

## 2017-06-15 DIAGNOSIS — Z8719 Personal history of other diseases of the digestive system: Secondary | ICD-10-CM

## 2017-06-15 DIAGNOSIS — R112 Nausea with vomiting, unspecified: Secondary | ICD-10-CM | POA: Diagnosis not present

## 2017-06-15 DIAGNOSIS — Z8711 Personal history of peptic ulcer disease: Secondary | ICD-10-CM

## 2017-06-15 DIAGNOSIS — D509 Iron deficiency anemia, unspecified: Secondary | ICD-10-CM | POA: Diagnosis not present

## 2017-06-15 DIAGNOSIS — R350 Frequency of micturition: Secondary | ICD-10-CM | POA: Diagnosis not present

## 2017-06-15 LAB — POCT CBC
Granulocyte percent: 61.5 %G (ref 37–80)
HCT, POC: 33.4 % — AB (ref 43.5–53.7)
HEMOGLOBIN: 10.7 g/dL — AB (ref 14.1–18.1)
LYMPH, POC: 1.7 (ref 0.6–3.4)
MCH, POC: 29.1 pg (ref 27–31.2)
MCHC: 32.1 g/dL (ref 31.8–35.4)
MCV: 90.6 fL (ref 80–97)
MID (cbc): 0.5 (ref 0–0.9)
MPV: 5.9 fL (ref 0–99.8)
POC Granulocyte: 3.4 (ref 2–6.9)
POC LYMPH PERCENT: 30.1 %L (ref 10–50)
POC MID %: 8.4 %M (ref 0–12)
Platelet Count, POC: 364 10*3/uL (ref 142–424)
RBC: 3.68 M/uL — AB (ref 4.69–6.13)
RDW, POC: 19.9 %
WBC: 5.6 10*3/uL (ref 4.6–10.2)

## 2017-06-15 LAB — POCT URINALYSIS DIP (MANUAL ENTRY)
BILIRUBIN UA: NEGATIVE
GLUCOSE UA: NEGATIVE mg/dL
Ketones, POC UA: NEGATIVE mg/dL
Nitrite, UA: NEGATIVE
PH UA: 6 (ref 5.0–8.0)
Protein Ur, POC: NEGATIVE mg/dL
RBC UA: NEGATIVE
SPEC GRAV UA: 1.02 (ref 1.010–1.025)
UROBILINOGEN UA: 0.2 U/dL

## 2017-06-15 NOTE — Patient Instructions (Signed)
     IF you received an x-ray today, you will receive an invoice from [facility]. Please contact [facility] at [phone] with questions or concerns regarding your invoice.   IF you received labwork today, you will receive an invoice from [facility]. Please contact [facility] at [phone] with questions or concerns regarding your invoice.   Our billing staff will not be able to assist you with questions regarding bills from these companies.  You will be contacted with the lab results as soon as they are available. The fastest way to get your results is to activate your My Chart account. Instructions are located on the last page of this paperwork. If you have not heard from us regarding the results in 2 weeks, please contact this office.     

## 2017-06-15 NOTE — Progress Notes (Signed)
Breylin Dom  MRN: [id number] DOB: 02/03/1970  PCP: Ethelda Chick, MD  Chief Complaint  Patient presents with  . Nausea    follow up     Subjective:  Kyle Allen is a 47 year old male with h/o stomach ulcers and recent ED admission for hypokalemia and atypical pneumonia presents to clinic for 2 day follow up of nausea and vomiting. Previously seen 06/13/17 by Dr. Chilton Si.  He is doing much better today with appetite, energy, and motivation increasing. He states he is not completely energized but overall feeling better. He has been gassy throughout this week and had his first bowel movement since ED this morning before breakfast (solid, light brown).  He was previously confused about his omeprazole prescription and had taken himself off, but restarted the twice daily dose after clarification from Dr. Chilton Si. He notes his reflux has improved since restarting omeprazole. He will sometimes augment with pantoprazole if he doesn't feel omeprazole will work. He has appointment with GI 07/09/17.  He denies hematuria but still has evidence of kidney stones per ED CT scan 06/10/17.  Recently finished azithromycin dose for pneumonia.  Zofran ODT is significantly helping nausea. He is slightly concerned about stopping medication but does not feel that he will be acutely ill if coming off. He has taken himself off iron pills due to resulting symptoms of constipation.  He notes that he has been experiencing urinary frequency as of yesterday. He has been drinking significant amounts of water and Pedialyte since discharge and his visit here 2 days ago. He notes that he will have to urinate within 15-20 minutes of drinking a bottle of water.   Of note, patient is frustrated by illnesses over the past year. 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 He has been unable to put weight back on.   History is obtained by patient.  Review of Systems    Constitutional: Positive for appetite change (slowly increasing) and fatigue (slowly getting better). Negative for chills, diaphoresis and fever.  Respiratory: Positive for cough (not anymore than usual). Negative for chest tightness and shortness of breath.   Cardiovascular: Negative for chest pain and palpitations.  Gastrointestinal: Positive for abdominal pain (not significant) and nausea (better on zofran). Negative for blood in stool, constipation, diarrhea, rectal pain and vomiting.  Endocrine: Negative for cold intolerance and heat intolerance.  Genitourinary: Positive for frequency. Negative for difficulty urinating, dysuria and hematuria (sometimes "charcoal gray").  Musculoskeletal: Negative for arthralgias and myalgias.  Skin: Negative for color change and rash.  Neurological: Positive for light-headedness (little "fuzzy" in am, but better after stretching ). Negative for dizziness, numbness and headaches.    Patient Active Problem List   Diagnosis Date Noted  . Cough 03/12/2017  . Lower respiratory infection 03/12/2017  . Anxiety and depression 09/18/2013  . Insomnia 09/18/2013  . Smoker 09/18/2013  . Substance abuse in remission (HCC) 09/18/2013  . Herpes labialis 09/18/2013    Current Outpatient Medications on File Prior to Visit  Medication Sig Dispense Refill  . acetaminophen (TYLENOL) 500 MG tablet Take 1,000 mg by mouth every 6 (six) hours as needed (For headache.).    Marland Kitchen azithromycin (ZITHROMAX) 250 MG tablet   0  . buPROPion (WELLBUTRIN) 100 MG tablet Take 100 mg by mouth 2 (two) times daily.    . cetirizine (ZYRTEC) 10 MG tablet Take 1 tablet (10 mg total) by mouth daily. 30 tablet 11  . Cyanocobalamin (VITAMIN  B 12 PO) Take 1 tablet by mouth daily.    . Multiple Vitamin (MULTIVITAMIN WITH MINERALS) TABS tablet Take 1 tablet by mouth daily.    Marland Kitchen. omeprazole (PRILOSEC) 20 MG capsule TAKE 1 CAPSULE BY MOUTH TWICE DAILY BEFORE A MEAL 60 capsule 0  . ondansetron  (ZOFRAN ODT) 4 MG disintegrating tablet Take 1 tablet (4 mg total) by mouth every 8 (eight) hours as needed for nausea or vomiting. 10 tablet 0  . potassium chloride SA (K-DUR,KLOR-CON) 20 MEQ tablet Take 1 tablet (20 mEq total) by mouth daily for 5 days. 10 tablet 0   No current facility-administered medications on file prior to visit.     Allergies  Allergen Reactions  . Carafate [Sucralfate] Other (See Comments)    "Made sick on stomach"    Past Medical History:  Diagnosis Date  . Allergy   . Anemia   . Anxiety   . Depression   . GERD (gastroesophageal reflux disease)   . History of kidney stones    episodes x1 -multiple stones- not a bother at this time.  . Hypertension    not on meds presently by MD action.  . Substance abuse [facility](HCC)    Social History   Social History Narrative   Marital status: single; not dating.  From [place], [place]       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      Tobacco:  1 ppd x 20 years.  Never quit.      Alcohol: in recovery; drinking 1/5 per day; duration 20 years; DWIs x 5; last DWI 09/2009.  Gets license back this month; four years without license.      Drugs:  Marijuana, prescription pills (pain medications Oxycontin, Vicodin, some benzos).  No heroine or cocaine.      Exercise:  Bike riding during summer months; weights at home.      Sexual activity: none in 2018; last sexual activity ten years.      Lives at home alone.   Right-handed.   No daily caffeine use.   Social History   Tobacco Use  . Smoking status: Current Every Day Smoker    Packs/day: 1.00    Years: 20.00    Pack years: 20.00    Types: Cigarettes  . Smokeless tobacco: Never Used  . Tobacco comment: Opiate use   Substance Use Topics  . Alcohol use: No    Alcohol/week: 0.0 oz    Comment: Quit  2015 also - attends AA.  . Drug use: Yes    Types: Marijuana, Other-see comments, Heroin    Comment: Quit 04-02-13  Attends AA   family history includes Cancer in his maternal grandmother; Diabetes in his maternal grandfather; Heart disease in his paternal grandfather; Hyperlipidemia in his brother, maternal grandfather, maternal grandmother, mother, paternal grandfather, and paternal grandmother; Hypertension in his brother, maternal grandfather, maternal grandmother, mother, paternal grandfather, and paternal grandmother.     Objective:  BP 114/71   Pulse (!) 53   Temp 99.1 F (37.3 C) (Oral)   Resp 18   Ht 6' (1.829 m)   Wt 141 lb (64 kg)   SpO2 98%   BMI 19.12 kg/m  Body mass index is 19.12 kg/m.  Wt Readings from Last 3 Encounters:  06/15/17 141 lb (64 kg)  06/13/17 142 lb (64.4 kg)  03/20/17 147 lb 6.4 oz (66.9 kg)  Physical Exam  Constitutional: He is oriented to person, place, and time. He appears well-developed and well-nourished. No distress.  HENT:  Head: Normocephalic and atraumatic.  Eyes: Pupils are equal, round, and reactive to light. Conjunctivae are normal.  Neck: No thyromegaly present.  Cardiovascular: Normal rate, regular rhythm and normal heart sounds. Exam reveals no gallop and no friction rub.  No murmur heard. Pulses:      Radial pulses are 2+ on the right side, and 2+ on the left side.       Dorsalis pedis pulses are 2+ on the right side, and 2+ on the left side.       Posterior tibial pulses are 2+ on the right side, and 2+ on the left side.  Distant heart sound  Pulmonary/Chest: Effort normal and breath sounds normal. No stridor. He has no wheezes. He has no rales.  Abdominal: Soft. Bowel sounds are normal. There is no tenderness.  Lymphadenopathy:    He has no cervical adenopathy.  Neurological: He is alert and oriented to person, place, and time.  Skin: Skin is warm and dry.   Orthostatic VS for the past 24 hrs:  BP- Lying Pulse- Lying BP- Standing at 0 minutes Pulse- Standing at 0 minutes  06/15/17 1241 114/66 (!) 45 117/72 50   Results for orders  placed or performed in visit on 06/15/17  POCT urinalysis dipstick  Result Value Ref Range   Color, UA yellow yellow   Clarity, UA clear clear   Glucose, UA negative negative mg/dL   Bilirubin, UA negative negative   Ketones, POC UA negative negative mg/dL   Spec Grav, UA 1.914 7.829 - 1.025   Blood, UA negative negative   pH, UA 6.0 5.0 - 8.0   Protein Ur, POC negative negative mg/dL   Urobilinogen, UA 0.2 0.2 or 1.0 E.U./dL   Nitrite, UA Negative Negative   Leukocytes, UA Trace (A) Negative  POCT CBC  Result Value Ref Range   WBC 5.6 4.6 - 10.2 K/uL   Lymph, poc 1.7 0.6 - 3.4   POC LYMPH PERCENT 30.1 10 - 50 %L   MID (cbc) 0.5 0 - 0.9   POC MID % 8.4 0 - 12 %M   POC Granulocyte 3.4 2 - 6.9   Granulocyte percent 61.5 37 - 80 %G   RBC 3.68 (A) 4.69 - 6.13 M/uL   Hemoglobin 10.7 (A) 14.1 - 18.1 g/dL   HCT, POC 56.2 (A) 13.0 - 53.7 %   MCV 90.6 80 - 97 fL   MCH, POC 29.1 27 - 31.2 pg   MCHC 32.1 31.8 - 35.4 g/dL   RDW, POC 86.5 %   Platelet Count, POC 364 142 - 424 K/uL   MPV 5.9 0 - 99.8 fL    Assessment and Plan :  1. Urinary frequency - POCT urinalysis dipstick - May decrease water intake to avoid excess urination esp since he appears to be hydrated at this visit  2. Nausea and vomiting, intractability of vomiting not specified, unspecified vomiting type -  Feels much better - suggested zantac for breakthrough heartburn vs protonix - continue advancment of diet as tolerated - t is hungry today for the 1st time in several days - Orthostatic vital signs - Taper off zofran by only taking 1/2 or 1/4 tablet. Leave at least 1 tablet to use if nausea restarts.  3. History of gastric ulcer - Avoid using pantoprazole since on omeprazole. PPI's take about 4 days for peak  efficacy. If additional refulx relief needed, try taking OTC Zantac 150mg .   4. Iron deficiency anemia, unspecified iron deficiency anemia type - POCT CBC - slightly decreased today but could be hydration  status today compared with last visit as todays values are closer to what they have been  In the past several months.  Follow-up with office at scheduled time in July.   Benny Lennert PA-C  Primary [facility] at [facility] 06/16/2017 12:14 AM

## 2017-06-16 ENCOUNTER — Encounter: Payer: Self-pay | Admitting: Physician Assistant

## 2017-06-16 NOTE — Telephone Encounter (Signed)
Message from pt's mother sent to Dr. Katrinka BlazingSmith

## 2017-06-17 ENCOUNTER — Encounter: Payer: Self-pay | Admitting: Family Medicine

## 2017-06-26 NOTE — Progress Notes (Signed)
Subjective:    Patient ID: Kyle Allen, male    DOB: 07-14-1970, 47 y.o.   MRN: [id number]  07/01/2017  Medical Management of Chronic Issues    HPI This 47 y.o. male presents for two week follow-up; no energy; quit job twice; won't accept resignation; cannot physically do it. No strength; no energy; could sleep all day long.  Today; did not leave chair; no energy. Horrible.  People asking what is wrong with patient.  Drinking water and gatorade; pedialyte.  Having some loose stools.  Drinking water and drinking excessively; must stop drinking before 2 hours.   At work three weeks ago; watch said 4:30pm; rady to get off; casually walked up to time clock; clocked out; left; didn't think anything of it. Fell asleep; looked at clock and it said 2:21pm.  Called EMS on self.  That same day, walking with plunger in hand; would not remember why had plunger; short term memory issues.   Drinks 8 water bottles at work.  Always drinks gatorade at home; only one middle.  Sweating at night; horrible night sweats.  Must change clothes twice. Then gets constipated horribly; then starts vomiting.  Taking iron; taking stool softener supplement. Misses one day per week on average.    Had appointment with psychiatrist; envision that would see psychiatrist; prescribed medication; return two weeks; prescribed a different medication; just prescribes medications. Laughed at provider.  Recent appointment last month; didn't reschedule.  Saw four times with four different medications.  All   Taking Wellbutrin 100mg  every morning.  Iron will constipate patient.  Mom has taking Miralax. Psychiatrist prescribed Seroquel; tried one night and knocked out; couldn't move for eight hours; felt trapped. When out of treatment, weighed 185 lb. Looked and felt great.  Now doing horribly.  Concerned this is the life of an addict in recovery.    Recent labs/studies: HIV negative 06/11/17. Ethanol negative 06/11/17.  Urine drug  screen negative 06/11/17. Hemoccult negative 04/02/17 x 3. Iron low at 21 03/20/17. Lipase normal Lipase6/13/19. PSA 0.4 12/2016. AAA 06/13/17: gallstone present and B small renal stones present. CXR 06/11/17: streaky and ground glass opacities present.  CT head 06/10/17: no acute process.  Atrophy present. CT renal protocol 06/10/17:  Ground glass opacities present B: uncomplicated cholelithiasis; B nephrolithiasis without obstructive process.  Treated with Zithromax and keflex; referred to pulmonology to undergo evaluation for eosinophilic pneumonia. Colonoscopy 05/03/15: WNL. EGD 05/03/15: 2 gastric ulcers.  BP Readings from Last 3 Encounters:  07/01/17 132/66  06/15/17 114/71  06/13/17 (!) 100/54   Wt Readings from Last 3 Encounters:  07/01/17 141 lb 9.6 oz (64.2 kg)  06/15/17 141 lb (64 kg)  06/13/17 142 lb (64.4 kg)   Immunization History  Administered Date(s) Administered  . Hepatitis A, Adult 12/19/2016  . Hepatitis B, adult 12/19/2016  . Influenza Split 10/16/2014, 10/25/2015  . Influenza,inj,Quad PF,6+ Mos 09/16/2013  . Influenza-Unspecified 11/02/2016  . Tdap 09/16/2013    Review of Systems  Constitutional: Positive for diaphoresis and fatigue. Negative for activity change, appetite change, chills, fever and unexpected weight change.  HENT: Negative for congestion, dental problem, drooling, ear discharge, ear pain, facial swelling, hearing loss, mouth sores, nosebleeds, postnasal drip, rhinorrhea, sinus pressure, sinus pain, sneezing, sore throat, tinnitus, trouble swallowing and voice change.   Eyes: Negative for photophobia, pain, discharge, redness, itching and visual disturbance.  Respiratory: Negative for apnea, cough, choking, chest tightness, shortness of breath, wheezing and stridor.   Cardiovascular: Negative for chest pain, palpitations  and leg swelling.  Gastrointestinal: Positive for constipation. Negative for abdominal distention, abdominal pain, anal bleeding, blood  in stool, diarrhea, nausea and vomiting.  Endocrine: Negative for cold intolerance, heat intolerance, polydipsia, polyphagia and polyuria.  Genitourinary: Negative for decreased urine volume, difficulty urinating, discharge, dysuria, enuresis, flank pain, frequency, genital sores, hematuria, penile pain, penile swelling, scrotal swelling, testicular pain and urgency.  Musculoskeletal: Negative for arthralgias, back pain, gait problem, joint swelling, myalgias, neck pain and neck stiffness.  Skin: Negative for color change, pallor, rash and wound.  Allergic/Immunologic: Negative for environmental allergies, food allergies and immunocompromised state.  Neurological: Negative for dizziness, tremors, seizures, syncope, facial asymmetry, speech difficulty, weakness, light-headedness, numbness and headaches.  Hematological: Negative for adenopathy. Does not bruise/bleed easily.  Psychiatric/Behavioral: Positive for dysphoric mood. Negative for agitation, behavioral problems, confusion, decreased concentration, hallucinations, self-injury, sleep disturbance and suicidal ideas. The patient is nervous/anxious. The patient is not hyperactive.     Past Medical History:  Diagnosis Date  . Allergy   . Anemia   . Anxiety   . Depression   . GERD (gastroesophageal reflux disease)   . History of kidney stones    episodes x1 -multiple stones- not a bother at this time.  . Hypertension    not on meds presently by MD action.  . Substance abuse [facility])    Past Surgical History:  Procedure Laterality Date  . COLONOSCOPY WITH PROPOFOL N/A 05/03/2015   Procedure: COLONOSCOPY WITH PROPOFOL;  Surgeon: Charolett Bumpers, MD;  Location: [facility];  Service: Endoscopy;  Laterality: N/A;  . ESOPHAGOGASTRODUODENOSCOPY (EGD) WITH PROPOFOL N/A 05/03/2015   Procedure: ESOPHAGOGASTRODUODENOSCOPY (EGD) WITH PROPOFOL;  Surgeon: Charolett Bumpers, MD;  Location: [facility];  Service: Endoscopy;  Laterality: N/A;   Allergies    Allergen Reactions  . Carafate [Sucralfate] Other (See Comments)    "Made sick on stomach"   Current Outpatient Medications on File Prior to Visit  Medication Sig Dispense Refill  . buPROPion (WELLBUTRIN) 100 MG tablet Take 100 mg by mouth 2 (two) times daily.    . cetirizine (ZYRTEC) 10 MG tablet Take 1 tablet (10 mg total) by mouth daily. 30 tablet 11  . Cyanocobalamin (VITAMIN B 12 PO) Take 1 tablet by mouth daily.    . ferrous sulfate 325 (65 FE) MG tablet Take 325 mg by mouth 2 (two) times daily with a meal.    . Multiple Vitamin (MULTIVITAMIN WITH MINERALS) TABS tablet Take 1 tablet by mouth daily.    . polyethylene glycol (MIRALAX / GLYCOLAX) packet Take 17 g by mouth daily.    . ranitidine (ZANTAC) 150 MG tablet Take 150 mg by mouth at bedtime.     No current facility-administered medications on file prior to visit.    Social History   Socioeconomic History  . Marital status: Single    Spouse name: Not on file  . Number of children: 0  . Years of education: 4 years college  . Highest education level: Not on file  Occupational History  . Occupation: Maintenance crew  Social Needs  . Financial resource strain: Not on file  . Food insecurity:    Worry: Not on file    Inability: Not on file  . Transportation needs:    Medical: Not on file    Non-medical: Not on file  Tobacco Use  . Smoking status: Current Every Day Smoker    Packs/day: 1.00    Years: 20.00    Pack years: 20.00    Types:  Cigarettes  . Smokeless tobacco: Never Used  . Tobacco comment: Opiate use   Substance and Sexual Activity  . Alcohol use: No    Alcohol/week: 0.0 oz    Comment: Quit  2015 also - attends AA.  . Drug use: Yes    Types: Marijuana, Other-see comments, Heroin    Comment: Quit 04-02-13 Attends AA  . Sexual activity: Not Currently  Lifestyle  . Physical activity:    Days per week: Not on file    Minutes per session: Not on file  . Stress: Not on file  Relationships  . Social  connections:    Talks on phone: Not on file    Gets together: Not on file    Attends religious service: Not on file    Active member of club or organization: Not on file    Attends meetings of clubs or organizations: Not on file    Relationship status: Not on file  . Intimate partner violence:    Fear of current or ex partner: Not on file    Emotionally abused: Not on file    Physically abused: Not on file    Forced sexual activity: Not on file  Other Topics Concern  . Not on file  Social History Narrative   Marital status: single; not dating.  From [place], [place].       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      Tobacco:  1 ppd x 20 years.  Never quit.      Alcohol: in recovery; drinking 1/5 per day; duration 20 years; DWIs x 5; last DWI 09/2009.  Gets license back this month; four years without license.      Drugs:  Marijuana, prescription pills (pain medications Oxycontin, Vicodin, some benzos).  No heroine or cocaine.      Exercise:  Bike riding during summer months; weights at home.      Sexual activity: none in 2018; last sexual activity ten years.      Lives at home alone.   Right-handed.   No daily caffeine use.   Family History  Problem Relation Age of Onset  . Hyperlipidemia Mother   . Hypertension Mother   . Hyperlipidemia Brother   . Hypertension Brother   . Cancer Maternal Grandmother   . Hyperlipidemia Maternal Grandmother   . Hypertension Maternal Grandmother   . Hyperlipidemia Maternal Grandfather   . Hypertension Maternal Grandfather   . Diabetes Maternal Grandfather   . Hyperlipidemia Paternal Grandmother   . Hypertension Paternal Grandmother   . Hyperlipidemia Paternal Grandfather   . Hypertension Paternal Grandfather   . Heart disease Paternal Grandfather        Objective:    BP 132/66   Pulse 74   Temp 100 F (37.8 C) (Oral)   Resp 20   Ht 6' (1.829 m)   Wt 141 lb 9.6 oz (64.2 kg)    SpO2 97%   BMI 19.20 kg/m  Physical Exam  Constitutional: He is oriented to person, place, and time. He appears well-developed and well-nourished. He appears ill. No distress.  HENT:  Head: Normocephalic and atraumatic.  Right Ear: External ear normal.  Left Ear: External ear normal.  Nose: Nose normal.  Mouth/Throat: Oropharynx is clear and moist.  Eyes: Pupils are equal, round, and reactive to light. Conjunctivae and EOM are normal.  Neck: Normal range of motion. Neck supple. Carotid bruit  is not present. No thyromegaly present.  Cardiovascular: Normal rate, regular rhythm, normal heart sounds and intact distal pulses. Exam reveals no gallop and no friction rub.  No murmur heard. Pulmonary/Chest: Effort normal and breath sounds normal. He has no wheezes. He has no rales.  Abdominal: Soft. Bowel sounds are normal. He exhibits no distension and no mass. There is no tenderness. There is no rebound and no guarding.  Musculoskeletal:       Right shoulder: Normal.       Left shoulder: Normal.       Cervical back: Normal.  Lymphadenopathy:    He has no cervical adenopathy.  Neurological: He is alert and oriented to person, place, and time. He has normal reflexes. He displays normal reflexes. No cranial nerve deficit or sensory deficit. He exhibits normal muscle tone. Coordination normal.  Skin: Skin is warm and dry. No rash noted. He is not diaphoretic.  Psychiatric: He has a normal mood and affect. His behavior is normal. Judgment and thought content normal.   No results found. Depression screen [facility] 2/9 07/01/2017 06/15/2017 06/13/2017 03/20/2017 03/12/2017  Decreased Interest 0 0 0 0 0  Down, Depressed, Hopeless 0 0 0 0 0  PHQ - 2 Score 0 0 0 0 0  Altered sleeping - - - - -  Tired, decreased energy - - - - -  Change in appetite - - - - -  Feeling bad or failure about yourself  - - - - -  Trouble concentrating - - - - -  Moving slowly or fidgety/restless - - - - -  Suicidal thoughts - -  - - -  PHQ-9 Score - - - - -  Difficult doing work/chores - - - - -  Some recent data might be hidden   Fall Risk  07/01/2017 06/15/2017 06/13/2017 03/20/2017 03/12/2017  Falls in the past year? No No No No No        Assessment & Plan:   1. Anxiety and depression   2. Psychophysiological insomnia   3. Iron deficiency anemia, unspecified iron deficiency anemia type   4. Fatigue, unspecified type   5. Abnormal weight loss   6. Substance abuse in remission (HCC)   7. Smoker   8. History of gastric ulcer   9. Gastroesophageal reflux disease without esophagitis   10. Calculus of gallbladder without cholecystitis without obstruction   11. Nephrolithiasis     Anxiety depression: Uncontrolled due to decline in overall health and quality of life.  Add Remeron 15 mg at bedtime for insomnia, increased appetite, depression.  Refer to psychology for psychotherapy.  Patient very frustrated with poor health while in recovery.  Stop trazodone.  Iron deficiency anemia: Persistent.  Iron supplementation causes constipation.  Taking MiraLAX.  Upcoming appointment with gastroenterology.  Status post endoscopy and colonoscopy in 2017.  Diagnosed with 2 gastric ulcers.  Symptoms currently not suggestive of gastric ulcers yet suffering with ongoing iron deficiency anemia and persistent reflux symptoms.  Warrants repeat endoscopy.  Fatigue with hypersomnia: Etiology unclear at this time.  Insomnia may be contributing to daytime hypersomnolence.  Treat insomnia with Remeron.  Refer for psychotherapy.  Obtain vitamin D, vitamin B12, iron levels.  Unintentional weight loss: Patient has lost 40 pounds in the past 3 years.  Significant weight loss since initiating W stopped husband at this time.  Ellbutrin.  Consider discontinuation of Wellbutrin yet will first add Remeron as patient's depression initially improved with Wellbutrin therapy.  Due to ongoing  tobacco abuse and history of substance abuse, concern of  unintentional weight loss.  Proceed with CT of chest and abdomen with contrast.  Upcoming appointment with gastroenterology for further consultation.  Recent thyroid panel is normal and negative in December 2018 with TSH of 1.65.  Status post recent CT of head that was negative.  Close follow-up for weight check.  Substance abuse in remission: Patient denies active use.  Recent urine drug screen and alcohol level negative.    Abnormal CT chest: ground glass appearance in bibasilar regions on chest x-ray and CT chest on June 11, 2017.  Radiologist suggested eosinophilic pneumonia.  ED provider recommended evaluation by pulmonology yet no referral placed in system.  Repeat CT chest; if no resolution, refer to pulmonology.    Orders Placed This Encounter  Procedures  . CT Chest W Contrast    Standing Status:   Future    Standing Expiration Date:   09/02/2018    Order Specific Question:   If indicated for the ordered procedure, I authorize the administration of contrast media per Radiology protocol    Answer:   Yes    Order Specific Question:   Preferred imaging location?    Answer:   [facility]    Order Specific Question:   Radiology Contrast Protocol - do NOT remove file path    Answer:   \\[email or web address]  . CT Abdomen Pelvis W Contrast    Standing Status:   Future    Standing Expiration Date:   10/02/2018    Order Specific Question:   If indicated for the ordered procedure, I authorize the administration of contrast media per Radiology protocol    Answer:   Yes    Order Specific Question:   Preferred imaging location?    Answer:   [facility]    Order Specific Question:   Is Oral Contrast requested for this exam?    Answer:   Yes, Per Radiology protocol    Order Specific Question:   Call Results- Best Contact Number?    Answer:   yes    Order Specific Question:   Radiology Contrast Protocol - do NOT remove file path    Answer:    \\[email or web address]  . CBC with Differential/Platelet  . Comprehensive metabolic panel    Order Specific Question:   Has the patient fasted?    Answer:   No  . Iron  . Sedimentation rate  . Vitamin B12  . VITAMIN D 25 Hydroxy (Vit-D Deficiency, Fractures)  . Ambulatory referral to Psychology    Referral Priority:   Routine    Referral Type:   Psychiatric    Referral Reason:   Specialty Services Required    Requested Specialty:   Psychology    Number of Visits Requested:   1   Meds ordered this encounter  Medications  . mirtazapine (REMERON) 15 MG tablet    Sig: Take 1 tablet (15 mg total) by mouth at bedtime.    Dispense:  30 tablet    Refill:  5    Return in about 2 weeks (around 07/15/2017) for recheck WEIGHT.   Tiny Rietz Paulita Fujita, M.D. Primary Care at [facility] [address] [place], [place]  [address] [phone] phone [phone] fax

## 2017-06-30 NOTE — Telephone Encounter (Signed)
Left message on mothers voicemail.

## 2017-07-01 ENCOUNTER — Other Ambulatory Visit: Payer: Self-pay

## 2017-07-01 ENCOUNTER — Ambulatory Visit: Payer: BLUE CROSS/BLUE SHIELD | Admitting: Family Medicine

## 2017-07-01 ENCOUNTER — Encounter: Payer: Self-pay | Admitting: Family Medicine

## 2017-07-01 VITALS — BP 132/66 | HR 74 | Temp 100.0°F | Resp 20 | Ht 72.0 in | Wt 141.6 lb

## 2017-07-01 DIAGNOSIS — F329 Major depressive disorder, single episode, unspecified: Secondary | ICD-10-CM | POA: Diagnosis not present

## 2017-07-01 DIAGNOSIS — D509 Iron deficiency anemia, unspecified: Secondary | ICD-10-CM | POA: Diagnosis not present

## 2017-07-01 DIAGNOSIS — Z8711 Personal history of peptic ulcer disease: Secondary | ICD-10-CM

## 2017-07-01 DIAGNOSIS — F1911 Other psychoactive substance abuse, in remission: Secondary | ICD-10-CM

## 2017-07-01 DIAGNOSIS — F5104 Psychophysiologic insomnia: Secondary | ICD-10-CM | POA: Diagnosis not present

## 2017-07-01 DIAGNOSIS — K219 Gastro-esophageal reflux disease without esophagitis: Secondary | ICD-10-CM

## 2017-07-01 DIAGNOSIS — N200 Calculus of kidney: Secondary | ICD-10-CM

## 2017-07-01 DIAGNOSIS — R5383 Other fatigue: Secondary | ICD-10-CM

## 2017-07-01 DIAGNOSIS — R634 Abnormal weight loss: Secondary | ICD-10-CM | POA: Diagnosis not present

## 2017-07-01 DIAGNOSIS — K8020 Calculus of gallbladder without cholecystitis without obstruction: Secondary | ICD-10-CM

## 2017-07-01 DIAGNOSIS — F32A Depression, unspecified: Secondary | ICD-10-CM

## 2017-07-01 DIAGNOSIS — R61 Generalized hyperhidrosis: Secondary | ICD-10-CM

## 2017-07-01 DIAGNOSIS — F17200 Nicotine dependence, unspecified, uncomplicated: Secondary | ICD-10-CM

## 2017-07-01 DIAGNOSIS — F419 Anxiety disorder, unspecified: Secondary | ICD-10-CM

## 2017-07-01 DIAGNOSIS — Z8719 Personal history of other diseases of the digestive system: Secondary | ICD-10-CM

## 2017-07-01 MED ORDER — MIRTAZAPINE 15 MG PO TABS: 15.0000 mg | ORAL_TABLET | Freq: Every day | ORAL | 5 refills | Status: AC

## 2017-07-01 NOTE — Patient Instructions (Addendum)
STOP TRAZODONE. START MIRTAZIPINE 15MG  ONE AT BEDTIME FOR INSOMNIA, APPETITE.    IF you received an x-ray today, you will receive an invoice from [facility]. Please contact [facility] at [phone] with questions or concerns regarding your invoice.   IF you received labwork today, you will receive an invoice from [facility]. Please contact [facility] at [phone] with questions or concerns regarding your invoice.   Our billing staff will not be able to assist you with questions regarding bills from these companies.  You will be contacted with the lab results as soon as they are available. The fastest way to get your results is to activate your My Chart account. Instructions are located on the last page of this paperwork. If you have not heard from us regarding the results in 2 weeks, please contact this office.

## 2017-07-02 LAB — COMPREHENSIVE METABOLIC PANEL
A/G RATIO: 2.7 — AB (ref 1.2–2.2)
ALT: 12 IU/L (ref 0–44)
AST: 15 IU/L (ref 0–40)
Albumin: 4.9 g/dL (ref 3.5–5.5)
Alkaline Phosphatase: 25 IU/L — ABNORMAL LOW (ref 39–117)
BUN/Creatinine Ratio: 16 (ref 9–20)
BUN: 24 mg/dL (ref 6–24)
CALCIUM: 9.4 mg/dL (ref 8.7–10.2)
CO2: 16 mmol/L — ABNORMAL LOW (ref 20–29)
Chloride: 108 mmol/L — ABNORMAL HIGH (ref 96–106)
Creatinine, Ser: 1.47 mg/dL — ABNORMAL HIGH (ref 0.76–1.27)
GFR, EST AFRICAN AMERICAN: 65 mL/min/{1.73_m2} (ref >59)
GFR, EST NON AFRICAN AMERICAN: 56 mL/min/{1.73_m2} — AB (ref >59)
Globulin, Total: 1.8 g/dL (ref 1.5–4.5)
Glucose: 100 mg/dL — ABNORMAL HIGH (ref 65–99)
POTASSIUM: 4.9 mmol/L (ref 3.5–5.2)
SODIUM: 141 mmol/L (ref 134–144)
TOTAL PROTEIN: 6.7 g/dL (ref 6.0–8.5)

## 2017-07-02 LAB — VITAMIN D 25 HYDROXY (VIT D DEFICIENCY, FRACTURES): Vit D, 25-Hydroxy: 36.9 ng/mL (ref 30.0–100.0)

## 2017-07-02 LAB — CBC WITH DIFFERENTIAL/PLATELET
BASOS: 1 %
Basophils Absolute: 0 10*3/uL (ref 0.0–0.2)
EOS (ABSOLUTE): 0.1 10*3/uL (ref 0.0–0.4)
Eos: 3 %
Hematocrit: 34.8 % — ABNORMAL LOW (ref 37.5–51.0)
Hemoglobin: 11.2 g/dL — ABNORMAL LOW (ref 13.0–17.7)
IMMATURE GRANULOCYTES: 0 %
Immature Grans (Abs): 0 10*3/uL (ref 0.0–0.1)
Lymphocytes Absolute: 1.3 10*3/uL (ref 0.7–3.1)
Lymphs: 36 %
MCH: 29.8 pg (ref 26.6–33.0)
MCHC: 32.2 g/dL (ref 31.5–35.7)
MCV: 93 fL (ref 79–97)
MONOS ABS: 0.3 10*3/uL (ref 0.1–0.9)
Monocytes: 8 %
NEUTROS ABS: 1.9 10*3/uL (ref 1.4–7.0)
Neutrophils: 52 %
PLATELETS: 282 10*3/uL (ref 150–450)
RBC: 3.76 x10E6/uL — ABNORMAL LOW (ref 4.14–5.80)
RDW: 20.4 % — AB (ref 12.3–15.4)
WBC: 3.6 10*3/uL (ref 3.4–10.8)

## 2017-07-02 LAB — IRON: Iron: 118 ug/dL (ref 38–169)

## 2017-07-02 LAB — VITAMIN B12: Vitamin B-12: 1370 pg/mL — ABNORMAL HIGH (ref 232–1245)

## 2017-07-02 LAB — SEDIMENTATION RATE: Sed Rate: 19 mm/hr — ABNORMAL HIGH (ref 0–15)

## 2017-07-03 ENCOUNTER — Other Ambulatory Visit: Payer: Self-pay | Admitting: Family Medicine

## 2017-07-03 ENCOUNTER — Encounter: Payer: Self-pay | Admitting: Family Medicine

## 2017-07-06 DIAGNOSIS — K8020 Calculus of gallbladder without cholecystitis without obstruction: Secondary | ICD-10-CM | POA: Insufficient documentation

## 2017-07-06 DIAGNOSIS — K219 Gastro-esophageal reflux disease without esophagitis: Secondary | ICD-10-CM | POA: Insufficient documentation

## 2017-07-06 DIAGNOSIS — N200 Calculus of kidney: Secondary | ICD-10-CM | POA: Insufficient documentation

## 2017-07-06 DIAGNOSIS — Z8711 Personal history of peptic ulcer disease: Secondary | ICD-10-CM | POA: Insufficient documentation

## 2017-07-06 DIAGNOSIS — Z8719 Personal history of other diseases of the digestive system: Secondary | ICD-10-CM | POA: Insufficient documentation

## 2017-07-07 ENCOUNTER — Other Ambulatory Visit: Payer: Self-pay | Admitting: Family Medicine

## 2017-07-09 DIAGNOSIS — K219 Gastro-esophageal reflux disease without esophagitis: Secondary | ICD-10-CM | POA: Diagnosis not present

## 2017-07-09 DIAGNOSIS — R634 Abnormal weight loss: Secondary | ICD-10-CM | POA: Diagnosis not present

## 2017-07-09 DIAGNOSIS — Z8711 Personal history of peptic ulcer disease: Secondary | ICD-10-CM | POA: Diagnosis not present

## 2017-07-09 DIAGNOSIS — R1084 Generalized abdominal pain: Secondary | ICD-10-CM | POA: Diagnosis not present

## 2017-07-16 ENCOUNTER — Encounter: Payer: Self-pay | Admitting: Family Medicine

## 2017-07-19 ENCOUNTER — Encounter: Payer: Self-pay | Admitting: Family Medicine

## 2017-07-19 ENCOUNTER — Other Ambulatory Visit: Payer: Self-pay

## 2017-07-19 ENCOUNTER — Ambulatory Visit: Payer: BLUE CROSS/BLUE SHIELD | Admitting: Family Medicine

## 2017-07-19 VITALS — BP 112/68 | HR 68 | Temp 98.0°F | Resp 16 | Ht 73.0 in | Wt 142.0 lb

## 2017-07-19 DIAGNOSIS — R61 Generalized hyperhidrosis: Secondary | ICD-10-CM

## 2017-07-19 DIAGNOSIS — R634 Abnormal weight loss: Secondary | ICD-10-CM

## 2017-07-19 DIAGNOSIS — F419 Anxiety disorder, unspecified: Secondary | ICD-10-CM

## 2017-07-19 DIAGNOSIS — D509 Iron deficiency anemia, unspecified: Secondary | ICD-10-CM | POA: Diagnosis not present

## 2017-07-19 DIAGNOSIS — F32A Depression, unspecified: Secondary | ICD-10-CM

## 2017-07-19 DIAGNOSIS — F329 Major depressive disorder, single episode, unspecified: Secondary | ICD-10-CM

## 2017-07-19 DIAGNOSIS — R5383 Other fatigue: Secondary | ICD-10-CM

## 2017-07-19 DIAGNOSIS — F5104 Psychophysiologic insomnia: Secondary | ICD-10-CM | POA: Diagnosis not present

## 2017-07-19 NOTE — Progress Notes (Signed)
Subjective:    Patient ID: Kyle Allen, male    DOB: 08/08/1970, [age] y.o.   MRN: [id number]  07/19/2017  Depression (1 Month follow-up )    HPI This 47 y.o. male presents for evaluation of insomnia, depression/anxiety, iron deficiency anemia, night sweats, malaise, hypersomnolence.  Management changes made at last visit include the following:.    Anxiety depression: Uncontrolled due to decline in overall health and quality of life.  Add Remeron 15 mg at bedtime for insomnia, increased appetite, depression.  Refer to psychology for psychotherapy.  Patient very frustrated with poor health while in recovery.  Stop trazodone.  Iron deficiency anemia: Persistent.  Iron supplementation causes constipation.  Taking MiraLAX.  Upcoming appointment with gastroenterology.  Status post endoscopy and colonoscopy in 2017.  Diagnosed with 2 gastric ulcers.  Symptoms currently not suggestive of gastric ulcers yet suffering with ongoing iron deficiency anemia and persistent reflux symptoms.  Warrants repeat endoscopy.  Fatigue with hypersomnia: Etiology unclear at this time.  Insomnia may be contributing to daytime hypersomnolence.  Treat insomnia with Remeron.  Refer for psychotherapy.  Obtain vitamin D, vitamin B12, iron levels.  Unintentional weight loss: Patient has lost 40 pounds in the past 3 years.  Significant weight loss since initiating Wellbutrin.  Consider discontinuation of Wellbutrin yet will first add Remeron as patient's depression initially improved with Wellbutrin therapy.  Due to ongoing tobacco abuse and history of substance abuse, concern of unintentional weight loss.  Proceed with CT of chest and abdomen with contrast.  Upcoming appointment with gastroenterology for further consultation.  Recent thyroid panel is normal and negative in December 2018 with TSH of 1.65.  Status post recent CT of head that was negative.  Close follow-up for weight check.  Substance abuse in remission:  Patient denies active use.  Recent urine drug screen and alcohol level negative.    Abnormal CT chest: ground glass appearance in bibasilar regions on chest x-ray and CT chest on June 11, 2017.  Radiologist suggested eosinophilic pneumonia.  ED provider recommended evaluation by pulmonology yet no referral placed in system.  Repeat CT chest; if no resolution, refer to pulmonology.   UPDATE SINCE LAST VISIT: Eating constantly; cannot gain weight.  Good appetite. Taking PPI bid.  Still having some issues; must take PPI as scheduled.  Zantac at bedtime. Saw [facility].  Suggested EGD possibly but then scheduled abdomen/pelvis CT.   Loves Remeron; sleeping well.   Started going to the gym right after work for 20 minutes for [employer].  To help with strength.   Works outside all day; making self stay awake.  Now able to make self get up.   Goes to bed at 10:00; wakes up 5:30am.   Mood: has lost all self esteem because of weight loss and appearance.  Self image is not good. Frustrated. Eating ice cream every night.  Bowl of ice cream after dinner.  Drinking protein shakes and ensure every morning.  Making self egg biscuits.  Oatmeal.   Has not been contacted regarding psychotherapy appointment/referral. Had not been contacted regarding CT chest/abdomen/pelvis. Stomach is feeling better so not undergoing EGD with Schooler.  Weight up one pound from last visit.   Sleeping better.  Able to do more during the day; has gone to work every day this week. Night sweats continue.  BP Readings from Last 3 Encounters:  07/19/17 112/68  07/01/17 132/66  06/15/17 114/71   Wt Readings from Last 3 Encounters:  07/19/17 142  lb (64.4 kg)  07/01/17 141 lb 9.6 oz (64.2 kg)  06/15/17 141 lb (64 kg)   Immunization History  Administered Date(s) Administered  . Hepatitis A, Adult 12/19/2016  . Hepatitis B, adult 12/19/2016  . Influenza Split 10/16/2014, 10/25/2015  . Influenza,inj,Quad PF,6+ Mos  09/16/2013  . Influenza-Unspecified 11/02/2016  . Tdap 09/16/2013    Review of Systems  Constitutional: Negative for activity change, appetite change, chills, diaphoresis, fatigue, fever and unexpected weight change.  HENT: Negative for congestion, dental problem, drooling, ear discharge, ear pain, facial swelling, hearing loss, mouth sores, nosebleeds, postnasal drip, rhinorrhea, sinus pressure, sinus pain, sneezing, sore throat, tinnitus, trouble swallowing and voice change.   Eyes: Negative for photophobia, pain, discharge, redness, itching and visual disturbance.  Respiratory: Negative for apnea, cough, choking, chest tightness, shortness of breath, wheezing and stridor.   Cardiovascular: Negative for chest pain, palpitations and leg swelling.  Gastrointestinal: Positive for abdominal distention. Negative for abdominal pain, anal bleeding, blood in stool, constipation, diarrhea, nausea, rectal pain and vomiting.  Endocrine: Negative for cold intolerance, heat intolerance, polydipsia, polyphagia and polyuria.  Genitourinary: Negative for decreased urine volume, difficulty urinating, discharge, dysuria, enuresis, flank pain, frequency, genital sores, hematuria, penile pain, penile swelling, scrotal swelling, testicular pain and urgency.  Musculoskeletal: Negative for arthralgias, back pain, gait problem, joint swelling, myalgias, neck pain and neck stiffness.  Skin: Negative for color change, pallor, rash and wound.  Allergic/Immunologic: Negative for environmental allergies, food allergies and immunocompromised state.  Neurological: Negative for dizziness, tremors, seizures, syncope, facial asymmetry, speech difficulty, weakness, light-headedness, numbness and headaches.  Hematological: Negative for adenopathy. Does not bruise/bleed easily.  Psychiatric/Behavioral: Positive for dysphoric mood and sleep disturbance. Negative for agitation, behavioral problems, confusion, decreased concentration,  hallucinations, self-injury and suicidal ideas. The patient is not nervous/anxious and is not hyperactive.     Past Medical History:  Diagnosis Date  . Allergy   . Anemia   . Anxiety   . Depression   . GERD (gastroesophageal reflux disease)   . History of kidney stones    episodes x1 -multiple stones- not a bother at this time.  . Hypertension    not on meds presently by MD action.  . Substance abuse [facility])    Past Surgical History:  Procedure Laterality Date  . COLONOSCOPY WITH PROPOFOL N/A 05/03/2015   Procedure: COLONOSCOPY WITH PROPOFOL;  Surgeon: Charolett Bumpers, MD;  Location: [facility] END[facility]COPY;  Service: Endoscopy;  Laterality: N/A;  . ESOPHAGOGASTRODUODENOSCOPY (EGD) WITH PROPOFOL N/A 05/03/2015   Procedure: ESOPHAGOGASTRODUODENOSCOPY (EGD) WITH PROPOFOL;  Surgeon: Charolett Bumpers, MD;  Location: [facility];  Service: Endoscopy;  Laterality: N/A;   No Active Allergies Current Outpatient Medications on File Prior to Visit  Medication Sig Dispense Refill  . buPROPion (WELLBUTRIN) 100 MG tablet Take 100 mg by mouth 2 (two) times daily.    . cetirizine (ZYRTEC) 10 MG tablet Take 1 tablet (10 mg total) by mouth daily. 30 tablet 11  . Cyanocobalamin (VITAMIN B 12 PO) Take 1 tablet by mouth daily.    . ferrous sulfate 325 (65 FE) MG tablet Take 325 mg by mouth 2 (two) times daily with a meal.    . mirtazapine (REMERON) 15 MG tablet Take 1 tablet (15 mg total) by mouth at bedtime. 30 tablet 5  . Multiple Vitamin (MULTIVITAMIN WITH MINERALS) TABS tablet Take 1 tablet by mouth daily.    Marland Kitchen omeprazole (PRILOSEC) 20 MG capsule TAKE 1 CAPSULE BY MOUTH TWICE DAILY BEFORE A MEAL 60  capsule 0  . polyethylene glycol (MIRALAX / GLYCOLAX) packet Take 17 g by mouth daily.    . ranitidine (ZANTAC) 150 MG tablet Take 150 mg by mouth at bedtime.     No current facility-administered medications on file prior to visit.    Social History   Socioeconomic History  . Marital status: Single    Spouse  name: Not on file  . Number of children: 0  . Years of education: 4 years college  . Highest education level: Not on file  Occupational History  . Occupation: Maintenance crew  Social Needs  . Financial resource strain: Not on file  . Food insecurity:    Worry: Not on file    Inability: Not on file  . Transportation needs:    Medical: Not on file    Non-medical: Not on file  Tobacco Use  . Smoking status: Current Every Day Smoker    Packs/day: 1.00    Years: 20.00    Pack years: 20.00    Types: Cigarettes  . Smokeless tobacco: Never Used  . Tobacco comment: Opiate use   Substance and Sexual Activity  . Alcohol use: No    Alcohol/week: 0.0 oz    Comment: Quit  2015 also - attends AA.  . Drug use: Yes    Types: Marijuana, Other-see comments, Heroin    Comment: Quit 04-02-13 Attends AA  . Sexual activity: Not Currently  Lifestyle  . Physical activity:    Days per week: Not on file    Minutes per session: Not on file  . Stress: Not on file  Relationships  . Social connections:    Talks on phone: Not on file    Gets together: Not on file    Attends religious service: Not on file    Active member of club or organization: Not on file    Attends meetings of clubs or organizations: Not on file    Relationship status: Not on file  . Intimate partner violence:    Fear of current or ex partner: Not on file    Emotionally abused: Not on file    Physically abused: Not on file    Forced sexual activity: Not on file  Other Topics Concern  . Not on file  Social History Narrative   Marital status: single; not dating.  From [place], [place].       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      Tobacco:  1 ppd x 20 years.  Never quit.      Alcohol: in recovery; drinking 1/5 per day; duration 20 years; DWIs x 5; last DWI 09/2009.  Gets license back this month; four years without license.      Drugs:  Marijuana, prescription pills  (pain medications Oxycontin, Vicodin, some benzos).  No heroine or cocaine.      Exercise:  Bike riding during summer months; weights at home.      Sexual activity: none in 2018; last sexual activity ten years.      Lives at home alone.   Right-handed.   No daily caffeine use.   Family History  Problem Relation Age of Onset  . Hyperlipidemia Mother   . Hypertension Mother   . Hyperlipidemia Brother   . Hypertension Brother   . Cancer Maternal Grandmother   . Hyperlipidemia Maternal Grandmother   . Hypertension Maternal Grandmother   . Hyperlipidemia Maternal  Grandfather   . Hypertension Maternal Grandfather   . Diabetes Maternal Grandfather   . Hyperlipidemia Paternal Grandmother   . Hypertension Paternal Grandmother   . Hyperlipidemia Paternal Grandfather   . Hypertension Paternal Grandfather   . Heart disease Paternal Grandfather        Objective:    BP 112/68   Pulse 68   Temp 98 F (36.7 C) (Oral)   Resp 16   Ht 6\' 1"  (1.854 m)   Wt 142 lb (64.4 kg)   SpO2 98%   BMI 18.73 kg/m  Physical Exam  Constitutional: He is oriented to person, place, and time. He appears well-developed and well-nourished. He appears ill. No distress.  HENT:  Head: Normocephalic and atraumatic.  Right Ear: External ear normal.  Left Ear: External ear normal.  Nose: Nose normal.  Mouth/Throat: Oropharynx is clear and moist.  Eyes: Pupils are equal, round, and reactive to light. Conjunctivae and EOM are normal.  Neck: Normal range of motion. Neck supple. Carotid bruit is not present. No thyromegaly present.  Cardiovascular: Normal rate, regular rhythm, normal heart sounds and intact distal pulses. Exam reveals no gallop and no friction rub.  No murmur heard. Pulmonary/Chest: Effort normal and breath sounds normal. He has no wheezes. He has no rales.  Abdominal: Soft. Bowel sounds are normal. He exhibits no distension and no mass. There is no tenderness. There is no rebound and no guarding.   Musculoskeletal:       Right shoulder: Normal.       Left shoulder: Normal.       Cervical back: Normal.  Lymphadenopathy:    He has no cervical adenopathy.  Neurological: He is alert and oriented to person, place, and time. He has normal reflexes. No cranial nerve deficit. He exhibits normal muscle tone. Coordination normal.  Skin: Skin is warm and dry. No rash noted. He is not diaphoretic.  Psychiatric: He has a normal mood and affect. His behavior is normal. Judgment and thought content normal.   No results found. Depression screen [facility] 2/9 07/19/2017 07/01/2017 06/15/2017 06/13/2017 03/20/2017  Decreased Interest 0 0 0 0 0  Down, Depressed, Hopeless 0 0 0 0 0  PHQ - 2 Score 0 0 0 0 0  Altered sleeping - - - - -  Tired, decreased energy - - - - -  Change in appetite - - - - -  Feeling bad or failure about yourself  - - - - -  Trouble concentrating - - - - -  Moving slowly or fidgety/restless - - - - -  Suicidal thoughts - - - - -  PHQ-9 Score - - - - -  Difficult doing work/chores - - - - -  Some recent data might be hidden   Fall Risk  07/19/2017 07/01/2017 06/15/2017 06/13/2017 03/20/2017  Falls in the past year? No No No No No        Assessment & Plan:   1. Anxiety and depression   2. Psychophysiological insomnia   3. Iron deficiency anemia, unspecified iron deficiency anemia type   4. Fatigue, unspecified type   5. Abnormal weight loss   6. Night sweats     Insomnia: Improved with addition of Remeron 15 mg at bedtime.  Patient happy with results and not interested in increasing 30 mg at this time.  Anxiety depression: Suffering mostly with depression due to physical appearance and ongoing weight loss.  Tolerating Remeron well.  Continue Wellbutrin at this time to consider  discontinuing due to weight loss. Check status of psychotherapy referral.  Iron deficiency anemia: Slightly improved at last visit.  Presented on gastroenterology consultation.  Dr. Cheryl Flash recommended  endoscopy and CT of abdomen and pelvis.  Patient continues to tolerate iron supplementation.  Denies any bloody stools or black stools.  Unintentional weight loss: Stable at this time.  Repeat labs and obtain TB Gold due to night sweats.  Obtain CT abdomen pelvis chest.  Recent HIV testing negative.  Denies any drug use.  Recent urine drug screen and alcohol testing negative.  Night sweats: Persistent.  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  Orders Placed This Encounter  Procedures  . Comprehensive metabolic panel  . CBC with Differential/Platelet  . QuantiFERON-TB Gold Plus   No orders of the defined types were placed in this encounter.   Return in about 1 month (around 08/16/2017) for follow-up chronic medical conditions SAGARDIA.   Etosha Wetherell Paulita Fujita, M.D. Primary Care at [facility] previously [facility] [address] [place], [place]  [address] [phone] phone [phone] fax

## 2017-07-19 NOTE — Patient Instructions (Addendum)
  Start using whole milk in oatmeal. Continue medications. CT abdomen and pelvis.   Therapy.    IF you received an x-ray today, you will receive an invoice from [facility]. Please contact [facility] at [phone] with questions or concerns regarding your invoice.   IF you received labwork today, you will receive an invoice from [facility]. Please contact [facility] at [phone] with questions or concerns regarding your invoice.   Our billing staff will not be able to assist you with questions regarding bills from these companies.  You will be contacted with the lab results as soon as they are available. The fastest way to get your results is to activate your My Chart account. Instructions are located on the last page of this paperwork. If you have not heard from us regarding the results in 2 weeks, please contact this office.

## 2017-07-23 LAB — CBC WITH DIFFERENTIAL/PLATELET
BASOS: 1 %
Basophils Absolute: 0 10*3/uL (ref 0.0–0.2)
EOS (ABSOLUTE): 0.2 10*3/uL (ref 0.0–0.4)
EOS: 4 %
HEMATOCRIT: 34 % — AB (ref 37.5–51.0)
Hemoglobin: 10.9 g/dL — ABNORMAL LOW (ref 13.0–17.7)
IMMATURE GRANULOCYTES: 0 %
Immature Grans (Abs): 0 10*3/uL (ref 0.0–0.1)
LYMPHS ABS: 0.9 10*3/uL (ref 0.7–3.1)
Lymphs: 17 %
MCH: 30.5 pg (ref 26.6–33.0)
MCHC: 32.1 g/dL (ref 31.5–35.7)
MCV: 95 fL (ref 79–97)
MONOS ABS: 0.3 10*3/uL (ref 0.1–0.9)
Monocytes: 6 %
Neutrophils Absolute: 3.7 10*3/uL (ref 1.4–7.0)
Neutrophils: 72 %
Platelets: 326 10*3/uL (ref 150–450)
RBC: 3.57 x10E6/uL — ABNORMAL LOW (ref 4.14–5.80)
RDW: 18.4 % — AB (ref 12.3–15.4)
WBC: 5.2 10*3/uL (ref 3.4–10.8)

## 2017-07-23 LAB — QUANTIFERON-TB GOLD PLUS
QUANTIFERON NIL VALUE: 0.05 [IU]/mL
QUANTIFERON TB2 AG VALUE: 0.05 [IU]/mL
QuantiFERON TB1 Ag Value: 0.05 IU/mL
QuantiFERON-TB Gold Plus: NEGATIVE

## 2017-07-23 LAB — COMPREHENSIVE METABOLIC PANEL
ALBUMIN: 4.3 g/dL (ref 3.5–5.5)
ALT: 9 IU/L (ref 0–44)
AST: 14 IU/L (ref 0–40)
Albumin/Globulin Ratio: 2.4 — ABNORMAL HIGH (ref 1.2–2.2)
Alkaline Phosphatase: 24 IU/L — ABNORMAL LOW (ref 39–117)
BUN / CREAT RATIO: 18 (ref 9–20)
BUN: 20 mg/dL (ref 6–24)
Bilirubin Total: <0.2 mg/dL (ref 0.0–1.2)
CALCIUM: 9 mg/dL (ref 8.7–10.2)
CO2: 18 mmol/L — AB (ref 20–29)
CREATININE: 1.12 mg/dL (ref 0.76–1.27)
Chloride: 108 mmol/L — ABNORMAL HIGH (ref 96–106)
GFR calc Af Amer: 90 mL/min/{1.73_m2} (ref >59)
GFR, EST NON AFRICAN AMERICAN: 78 mL/min/{1.73_m2} (ref >59)
GLOBULIN, TOTAL: 1.8 g/dL (ref 1.5–4.5)
Glucose: 99 mg/dL (ref 65–99)
Potassium: 4.6 mmol/L (ref 3.5–5.2)
SODIUM: 141 mmol/L (ref 134–144)
Total Protein: 6.1 g/dL (ref 6.0–8.5)

## 2017-07-26 ENCOUNTER — Encounter: Payer: Self-pay | Admitting: Family Medicine

## 2017-07-30 ENCOUNTER — Other Ambulatory Visit: Payer: Self-pay | Admitting: Family Medicine

## 2017-07-30 ENCOUNTER — Telehealth: Payer: Self-pay | Admitting: Family Medicine

## 2017-07-30 ENCOUNTER — Encounter: Payer: Self-pay | Admitting: Family Medicine

## 2017-07-30 NOTE — Telephone Encounter (Signed)
Copied from CRM [id number]. Topic: Quick Communication - See Telephone Encounter >> Jul 30, 2017 11:33 AM Herby AbrahamJohnson, Shiquita C wrote: CRM for notification. See Telephone encounter for: 07/30/17.  Melissa with [facility] called in. She would like to make PCP aware that pt was seen on  07/09/17 abdominal pain and weight loss, she said that pt cancelled both appts and didn't not follow up on their recommendations.

## 2017-07-31 ENCOUNTER — Other Ambulatory Visit: Payer: Self-pay | Admitting: Family Medicine

## 2017-08-02 ENCOUNTER — Encounter: Payer: Self-pay | Admitting: Emergency Medicine

## 2017-08-04 NOTE — Telephone Encounter (Signed)
No record of this medication prescribed by us. I don't know this patient, please look into this before we blindly prescribe it.Thanks.

## 2017-08-05 NOTE — Telephone Encounter (Signed)
Go ahead and prescribe it. Thanks.

## 2017-08-07 ENCOUNTER — Encounter: Payer: Self-pay | Admitting: Emergency Medicine

## 2017-08-08 ENCOUNTER — Other Ambulatory Visit: Payer: Self-pay

## 2017-08-08 MED ORDER — FLUOXETINE HCL 20 MG PO TABS: 20.0000 mg | ORAL_TABLET | Freq: Every day | ORAL | 3 refills | Status: DC

## 2017-08-08 NOTE — Progress Notes (Signed)
[facility] to [facility] [address]

## 2017-08-23 ENCOUNTER — Other Ambulatory Visit: Payer: Self-pay

## 2017-08-23 ENCOUNTER — Encounter: Payer: Self-pay | Admitting: Emergency Medicine

## 2017-08-23 ENCOUNTER — Ambulatory Visit (INDEPENDENT_AMBULATORY_CARE_PROVIDER_SITE_OTHER): Payer: BLUE CROSS/BLUE SHIELD | Admitting: Emergency Medicine

## 2017-08-23 VITALS — BP 126/74 | HR 76 | Temp 99.3°F | Resp 16 | Ht 73.0 in | Wt 146.6 lb

## 2017-08-23 DIAGNOSIS — Z23 Encounter for immunization: Secondary | ICD-10-CM

## 2017-08-23 DIAGNOSIS — F17200 Nicotine dependence, unspecified, uncomplicated: Secondary | ICD-10-CM

## 2017-08-23 DIAGNOSIS — F329 Major depressive disorder, single episode, unspecified: Secondary | ICD-10-CM | POA: Diagnosis not present

## 2017-08-23 DIAGNOSIS — F419 Anxiety disorder, unspecified: Secondary | ICD-10-CM

## 2017-08-23 DIAGNOSIS — F32A Depression, unspecified: Secondary | ICD-10-CM

## 2017-08-23 NOTE — Patient Instructions (Addendum)
   If you have lab work done today you will be contacted with your lab results within the next 2 weeks.  If you have not heard from us then please contact us. The fastest way to get your results is to register for My Chart.   IF you received an x-ray today, you will receive an invoice from [facility]. Please contact [facility] at [phone] with questions or concerns regarding your invoice.   IF you received labwork today, you will receive an invoice from [facility]. Please contact [facility] at [phone] with questions or concerns regarding your invoice.   Our billing staff will not be able to assist you with questions regarding bills from these companies.  You will be contacted with the lab results as soon as they are available. The fastest way to get your results is to activate your My Chart account. Instructions are located on the last page of this paperwork. If you have not heard from us regarding the results in 2 weeks, please contact this office.     Major Depressive Disorder, Adult Major depressive disorder (MDD) is a mental health condition. MDD often makes you feel sad, hopeless, or helpless. MDD can also cause symptoms in your body. MDD can affect your:  Work.  School.  Relationships.  Other normal activities.  MDD can range from mild to very bad. It may occur once (single episode MDD). It can also occur many times (recurrent MDD). The main symptoms of MDD often include:  Feeling sad, depressed, or irritable most of the time.  Loss of interest.  MDD symptoms also include:  Sleeping too much or too little.  Eating too much or too little.  A change in your weight.  Feeling tired (fatigue) or having low energy.  Feeling worthless.  Feeling guilty.  Trouble making decisions.  Trouble thinking clearly.  Thoughts of suicide or harming others.  Feeling weak.  Feeling agitated.  Keeping yourself from being around other people  (isolation).  Follow these instructions at home: Activity  Do these things as told by your doctor: ? Go back to your normal activities. ? Exercise regularly. ? Spend time outdoors. Alcohol  Talk with your doctor about how alcohol can affect your antidepressant medicines.  Do not drink alcohol. Or, limit how much alcohol you drink. ? This means no more than 1 drink a day for nonpregnant women and 2 drinks a day for men. One drink equals one of these:  12 oz of beer.  5 oz of wine.  1 oz of hard liquor. General instructions  Take over-the-counter and prescription medicines only as told by your doctor.  Eat a healthy diet.  Get plenty of sleep.  Find activities that you enjoy. Make time to do them.  Think about joining a support group. Your doctor may be able to suggest a group for you.  Keep all follow-up visits as told by your doctor. This is important. Where to find more information:  National Alliance on Mental Illness: ? www.nami.org  U.S[place] National Institute of Mental Health: ? www.nimh.nih.gov  National Suicide Prevention Lifeline: ? 1-800-273-8255. This is free, 24-hour help. Contact a doctor if:  Your symptoms get worse.  You have new symptoms. Get help right away if:  You self-harm.  You see, hear, taste, smell, or feel things that are not present (hallucinate). If you ever feel like you may hurt yourself or others, or have thoughts about taking your own life, get help right away. You can   go to your nearest emergency department or call:  Your local emergency services (911 in the [place]  A suicide crisis helpline, such as the National Suicide Prevention Lifeline: ? 1-800-273-8255. This is open 24 hours a day.  This information is not intended to replace advice given to you by your health care provider. Make sure you discuss any questions you have with your health care provider. Document Released: 11/29/2014 Document Revised: 09/04/2015 Document  Reviewed: 09/04/2015 Elsevier Interactive Patient Education  2017 [employer].  

## 2017-08-23 NOTE — Progress Notes (Signed)
Kyle Allen 47 y.o.   Chief Complaint  Patient presents with  . Depression  . Anxiety    HISTORY OF PRESENT ILLNESS: This is a 47 y.o. male with chronic medical problems here for follow-up of the following conditions: 1.  Depression with anxiety.  Doing well, taking Prozac.  No concerns. 2.  History of hypertension: Well-controlled, on no medications at present time. 3.  History of GERD: Takes PPI in the morning and H2 blocker at night with good results.  Under control. 4.  Insomnia: Takes Remeron 15 mg.  Working well.  Under control. 5.  History of substance abuse and alcohol.  Sober and clean now for 4 years.  Doing well. 6.  Chronic smoker.  No interest in stopping at present time. Clinically stable.  No medical concerns at present time.  Requesting influenza shot. No other significant problems or concerns. HPI   Prior to Admission medications   Medication Sig Start Date End Date Taking? Authorizing Provider  buPROPion (WELLBUTRIN) 100 MG tablet Take 100 mg by mouth 2 (two) times daily.   Yes [provider]  cetirizine (ZYRTEC) 10 MG tablet Take 1 tablet (10 mg total) by mouth daily. 04/05/16  Yes Ethelda Chick, MD  Cyanocobalamin (VITAMIN B 12 PO) Take 1 tablet by mouth daily.   Yes [provider]  ferrous sulfate 325 (65 FE) MG tablet Take 325 mg by mouth 2 (two) times daily with a meal.   Yes [provider]  FLUoxetine (PROZAC) 20 MG tablet Take 1 tablet (20 mg total) by mouth daily. 08/08/17  Yes Seriyah Collison, Eilleen Kempf, MD  mirtazapine (REMERON) 15 MG tablet Take 1 tablet (15 mg total) by mouth at bedtime. 07/01/17  Yes Ethelda Chick, MD  Multiple Vitamin (MULTIVITAMIN WITH MINERALS) TABS tablet Take 1 tablet by mouth daily.   Yes [provider]  omeprazole (PRILOSEC) 20 MG capsule TAKE 1 CAPSULE BY MOUTH TWICE DAILY BEFORE A MEAL 07/31/17  Yes Ethelda Chick, MD  polyethylene glycol (MIRALAX / GLYCOLAX) packet Take 17 g by mouth daily.    Yes [provider]  ranitidine (ZANTAC) 150 MG tablet Take 150 mg by mouth at bedtime.   Yes [provider]    No Known Allergies  Patient Active Problem List   Diagnosis Date Noted  . History of gastric ulcer 07/06/2017  . Gastroesophageal reflux disease without esophagitis 07/06/2017  . Calculus of gallbladder without cholecystitis without obstruction 07/06/2017  . Nephrolithiasis 07/06/2017  . Cough 03/12/2017  . Anxiety and depression 09/18/2013  . Insomnia 09/18/2013  . Smoker 09/18/2013  . Substance abuse in remission (HCC) 09/18/2013  . Herpes labialis 09/18/2013    Past Medical History:  Diagnosis Date  . Allergy   . Anemia   . Anxiety   . Depression   . GERD (gastroesophageal reflux disease)   . History of kidney stones    episodes x1 -multiple stones- not a bother at this time.  . Hypertension    not on meds presently by MD action.  . Substance abuse [facility])     Past Surgical History:  Procedure Laterality Date  . COLONOSCOPY WITH PROPOFOL N/A 05/03/2015   Procedure: COLONOSCOPY WITH PROPOFOL;  Surgeon: Charolett Bumpers, MD;  Location: [facility];  Service: Endoscopy;  Laterality: N/A;  . ESOPHAGOGASTRODUODENOSCOPY (EGD) WITH PROPOFOL N/A 05/03/2015   Procedure: ESOPHAGOGASTRODUODENOSCOPY (EGD) WITH PROPOFOL;  Surgeon: Charolett Bumpers, MD;  Location: [facility];  Service: Endoscopy;  Laterality: N/A;  Social History   Socioeconomic History  . Marital status: Single    Spouse name: Not on file  . Number of children: 0  . Years of education: 4 years college  . Highest education level: Not on file  Occupational History  . Occupation: Maintenance crew  Social Needs  . Financial resource strain: Not on file  . Food insecurity:    Worry: Not on file    Inability: Not on file  . Transportation needs:    Medical: Not on file    Non-medical: Not on file  Tobacco Use  . Smoking status: Current Every Day Smoker    Packs/day: 1.00     Years: 20.00    Pack years: 20.00    Types: Cigarettes  . Smokeless tobacco: Never Used  . Tobacco comment: Opiate use   Substance and Sexual Activity  . Alcohol use: No    Alcohol/week: 0.0 standard drinks    Comment: Quit  2015 also - attends AA.  . Drug use: Yes    Types: Marijuana, Other-see comments, Heroin    Comment: Quit 04-02-13 Attends AA  . Sexual activity: Not Currently  Lifestyle  . Physical activity:    Days per week: Not on file    Minutes per session: Not on file  . Stress: Not on file  Relationships  . Social connections:    Talks on phone: Not on file    Gets together: Not on file    Attends religious service: Not on file    Active member of club or organization: Not on file    Attends meetings of clubs or organizations: Not on file    Relationship status: Not on file  . Intimate partner violence:    Fear of current or ex partner: Not on file    Emotionally abused: Not on file    Physically abused: Not on file    Forced sexual activity: Not on file  Other Topics Concern  . Not on file  Social History Narrative   Marital status: single; not dating.  From [place], [place].       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      Tobacco:  1 ppd x 20 years.  Never quit.      Alcohol: in recovery; drinking 1/5 per day; duration 20 years; DWIs x 5; last DWI 09/2009.  Gets license back this month; four years without license.      Drugs:  Marijuana, prescription pills (pain medications Oxycontin, Vicodin, some benzos).  No heroine or cocaine.      Exercise:  Bike riding during summer months; weights at home.      Sexual activity: none in 2018; last sexual activity ten years.      Lives at home alone.   Right-handed.   No daily caffeine use.    Family History  Problem Relation Age of Onset  . Hyperlipidemia Mother   . Hypertension Mother   . Hyperlipidemia Brother   . Hypertension Brother   . Cancer  Maternal Grandmother   . Hyperlipidemia Maternal Grandmother   . Hypertension Maternal Grandmother   . Hyperlipidemia Maternal Grandfather   . Hypertension Maternal Grandfather   . Diabetes Maternal Grandfather   . Hyperlipidemia Paternal Grandmother   . Hypertension Paternal Grandmother   . Hyperlipidemia Paternal Grandfather   . Hypertension Paternal Grandfather   . Heart disease Paternal Grandfather  Review of Systems  Constitutional: Positive for weight loss. Negative for chills and fever.  HENT: Negative.  Negative for congestion, ear pain, hearing loss and sore throat.   Eyes: Negative.  Negative for blurred vision and double vision.  Respiratory: Negative.  Negative for cough and shortness of breath.   Cardiovascular: Negative.  Negative for chest pain and palpitations.  Gastrointestinal: Negative.  Negative for abdominal pain, diarrhea, nausea and vomiting.  Genitourinary: Negative.  Negative for dysuria and hematuria.  Musculoskeletal: Negative.  Negative for back pain, myalgias and neck pain.  Skin: Negative.  Negative for rash.  Neurological: Negative.  Negative for dizziness, sensory change, focal weakness and headaches.  Endo/Heme/Allergies: Negative.   All other systems reviewed and are negative.   Vitals:   08/23/17 1650  BP: 126/74  Pulse: 76  Resp: 16  Temp: 99.3 F (37.4 C)  SpO2: 97%    Physical Exam  Constitutional: He is oriented to person, place, and time. He appears well-nourished.  HENT:  Head: Normocephalic and atraumatic.  Nose: Nose normal.  Mouth/Throat: Oropharynx is clear and moist.  Eyes: Pupils are equal, round, and reactive to light. Conjunctivae and EOM are normal.  Neck: Normal range of motion. Neck supple. No JVD present.  Cardiovascular: Normal rate, regular rhythm and normal heart sounds.  Pulmonary/Chest: Effort normal and breath sounds normal.  Abdominal: Soft. There is no tenderness.  Musculoskeletal: Normal range of  motion. He exhibits no edema or tenderness.  Lymphadenopathy:    He has no cervical adenopathy.  Neurological: He is alert and oriented to person, place, and time. No sensory deficit. He exhibits normal muscle tone.  Skin: Skin is warm and dry. Capillary refill takes less than 2 seconds. No rash noted.  Psychiatric: He has a normal mood and affect. His behavior is normal.  Vitals reviewed.    ASSESSMENT & PLAN: Rolm Galarik was seen today for depression and anxiety.  Diagnoses and all orders for this visit:  Anxiety and depression  Need for prophylactic vaccination and inoculation against influenza -     Flu Vaccine QUAD 6+ mos PF IM (Fluarix Quad PF)  Smoker    Patient Instructions       If you have lab work done today you will be contacted with your lab results within the next 2 weeks.  If you have not heard from us then please contact us. The fastest way to get your results is to register for My Chart.   IF you received an x-ray today, you will receive an invoice from [facility]. Please contact [facility] at [phone] with questions or concerns regarding your invoice.   IF you received labwork today, you will receive an invoice from [facility]. Please contact [facility] at [phone] with questions or concerns regarding your invoice.   Our billing staff will not be able to assist you with questions regarding bills from these companies.  You will be contacted with the lab results as soon as they are available. The fastest way to get your results is to activate your My Chart account. Instructions are located on the last page of this paperwork. If you have not heard from us regarding the results in 2 weeks, please contact this office.      Major Depressive Disorder, Adult Major depressive disorder (MDD) is a mental health condition. MDD often makes you feel sad, hopeless, or helpless. MDD can also cause symptoms in your body. MDD can affect  your:  Work.  School.  Relationships.  Other normal activities.  MDD can range from mild to very bad. It may occur once (single episode MDD). It can also occur many times (recurrent MDD). The main symptoms of MDD often include:  Feeling sad, depressed, or irritable most of the time.  Loss of interest.  MDD symptoms also include:  Sleeping too much or too little.  Eating too much or too little.  A change in your weight.  Feeling tired (fatigue) or having low energy.  Feeling worthless.  Feeling guilty.  Trouble making decisions.  Trouble thinking clearly.  Thoughts of suicide or harming others.  Feeling weak.  Feeling agitated.  Keeping yourself from being around other people (isolation).  Follow these instructions at home: Activity  Do these things as told by your doctor: ? Go back to your normal activities. ? Exercise regularly. ? Spend time outdoors. Alcohol  Talk with your doctor about how alcohol can affect your antidepressant medicines.  Do not drink alcohol. Or, limit how much alcohol you drink. ? This means no more than 1 drink a day for nonpregnant women and 2 drinks a day for men. One drink equals one of these:  12 oz of beer.  5 oz of wine.  1 oz of hard liquor. General instructions  Take over-the-counter and prescription medicines only as told by your doctor.  Eat a healthy diet.  Get plenty of sleep.  Find activities that you enjoy. Make time to do them.  Think about joining a support group. Your doctor may be able to suggest a group for you.  Keep all follow-up visits as told by your doctor. This is important. Where to find more information:  The [employer] on Mental Illness: ? www.nami.org  [place] [employer] of Mental Health: ? http://www.maynard.net/  National Suicide Prevention Lifeline: ? 619-045-6483. This is free, 24-hour help. Contact a doctor if:  Your symptoms get worse.  You have new symptoms. Get  help right away if:  You self-harm.  You see, hear, taste, smell, or feel things that are not present (hallucinate). If you ever feel like you may hurt yourself or others, or have thoughts about taking your own life, get help right away. You can go to your nearest emergency department or call:  Your local emergency services (911 in the [place]  A suicide crisis helpline, such as the National Suicide Prevention Lifeline: ? 336-102-1506. This is open 24 hours a day.  This information is not intended to replace advice given to you by your health care provider. Make sure you discuss any questions you have with your health care provider. Document Released: 11/29/2014 Document Revised: 09/04/2015 Document Reviewed: 09/04/2015 Elsevier Interactive Patient Education  2017 Elsevier Inc.      Edwina Barth, MD [facility]

## 2017-08-29 ENCOUNTER — Other Ambulatory Visit: Payer: Self-pay | Admitting: Family Medicine

## 2017-09-19 ENCOUNTER — Other Ambulatory Visit: Payer: Self-pay | Admitting: Family Medicine

## 2017-09-19 ENCOUNTER — Other Ambulatory Visit: Payer: Self-pay | Admitting: Emergency Medicine

## 2017-09-19 DIAGNOSIS — F17200 Nicotine dependence, unspecified, uncomplicated: Secondary | ICD-10-CM

## 2017-09-19 DIAGNOSIS — R112 Nausea with vomiting, unspecified: Secondary | ICD-10-CM

## 2017-09-19 MED ORDER — OMEPRAZOLE 20 MG PO CPDR: DELAYED_RELEASE_CAPSULE | ORAL | 0 refills | Status: DC

## 2017-09-19 NOTE — Telephone Encounter (Signed)
[facility] explained that request for trazodone was approved in error, and need approval from the provider; she verbalizes understanding and will cancel the order.  trazodone refill Last Refill: 03/12/17 # 90  Last OV: 07/19/17 PCP: last seen by Dr Alvy BimlerSagardia [facility] [address] [place]

## 2017-09-19 NOTE — Telephone Encounter (Signed)
Attempted to contact pt regarding request for augmentin, tamiflu, and tessalon; left message on voicemail [phone]; when pt calls back please obtain information related to symptoms; these medications are not on the pt's medication list.

## 2017-09-20 ENCOUNTER — Other Ambulatory Visit: Payer: Self-pay | Admitting: Family Medicine

## 2017-10-01 ENCOUNTER — Other Ambulatory Visit: Payer: Self-pay | Admitting: Emergency Medicine

## 2017-11-02 ENCOUNTER — Encounter: Payer: Self-pay | Admitting: Emergency Medicine

## 2017-11-30 ENCOUNTER — Encounter: Payer: Self-pay | Admitting: Family Medicine

## 2017-12-03 ENCOUNTER — Other Ambulatory Visit: Payer: Self-pay | Admitting: Emergency Medicine

## 2017-12-03 MED ORDER — OMEPRAZOLE 20 MG PO CPDR: 20.0000 mg | DELAYED_RELEASE_CAPSULE | Freq: Two times a day (BID) | ORAL | 2 refills | Status: DC

## 2017-12-03 MED ORDER — FLUOXETINE HCL 20 MG PO TABS: 20.0000 mg | ORAL_TABLET | Freq: Every day | ORAL | 3 refills | Status: AC

## 2017-12-03 NOTE — Telephone Encounter (Signed)
Prescriptions sent.  I have no advice on a general practitioner in that area.

## 2017-12-19 DIAGNOSIS — R112 Nausea with vomiting, unspecified: Secondary | ICD-10-CM | POA: Diagnosis not present

## 2017-12-19 DIAGNOSIS — R197 Diarrhea, unspecified: Secondary | ICD-10-CM | POA: Diagnosis not present

## 2018-01-29 DIAGNOSIS — R5383 Other fatigue: Secondary | ICD-10-CM | POA: Diagnosis not present

## 2018-01-29 DIAGNOSIS — R110 Nausea: Secondary | ICD-10-CM | POA: Diagnosis not present

## 2018-01-29 DIAGNOSIS — K529 Noninfective gastroenteritis and colitis, unspecified: Secondary | ICD-10-CM | POA: Diagnosis not present

## 2018-02-22 ENCOUNTER — Other Ambulatory Visit: Payer: Self-pay | Admitting: Emergency Medicine

## 2018-02-24 NOTE — Telephone Encounter (Signed)
Requested Prescriptions  Pending Prescriptions Disp Refills  . omeprazole (PRILOSEC) 20 MG capsule [Pharmacy Med Name: OMEPRAZOLE 20MG  CAPSULES] 60 capsule 2    Sig: TAKE ONE CAPSULE BY MOUTH TWICE DAILY BEFORE A MEAL     Gastroenterology: Proton Pump Inhibitors Passed - 02/22/2018  3:43 AM      Passed - Valid encounter within last 12 months    Recent Outpatient Visits          6 months ago Anxiety and depression   Primary Care at [facility], Eilleen Kempf, MD   7 months ago Anxiety and depression   Primary Care at [facility], Myrle Sheng, MD   7 months ago Anxiety and depression   Primary Care at [facility], Myrle Sheng, MD   8 months ago Urinary frequency   [facility] at [facility], Dema Severin, PA-C   8 months ago Generalized abdominal pain   Primary Care at [facility], Asencion Partridge, MD      Future Appointments            In 4 days [facility], Eilleen Kempf, MD Primary Care at [place], [facility]

## 2018-02-28 ENCOUNTER — Ambulatory Visit: Payer: BLUE CROSS/BLUE SHIELD | Admitting: Emergency Medicine

## 2018-03-20 ENCOUNTER — Other Ambulatory Visit: Payer: Self-pay | Admitting: Family Medicine

## 2018-03-20 NOTE — Telephone Encounter (Signed)
Requested Prescriptions  Pending Prescriptions Disp Refills  . omeprazole (PRILOSEC) 20 MG capsule [Pharmacy Med Name: OMEPRAZOLE 20MG  CAPSULES] 60 capsule 0    Sig: TAKE ONE CAPSULE BY MOUTH TWICE DAILY BEFORE A MEAL     Gastroenterology: Proton Pump Inhibitors Passed - 03/20/2018 11:31 AM      Passed - Valid encounter within last 12 months    Recent Outpatient Visits          6 months ago Anxiety and depression   Primary Care at [place], Eilleen Kempf, MD   8 months ago Anxiety and depression   Primary Care at [facility], Myrle Sheng, MD   8 months ago Anxiety and depression   Primary Care at [facility], Myrle Sheng, MD   9 months ago Urinary frequency   Primary Care at [facility], Dema Severin, PA-C   9 months ago Generalized abdominal pain   Primary Care at [facility], Asencion Partridge, MD

## 2018-03-23 ENCOUNTER — Other Ambulatory Visit: Payer: Self-pay | Admitting: Emergency Medicine

## 2018-07-24 ENCOUNTER — Other Ambulatory Visit: Payer: Self-pay | Admitting: Emergency Medicine

## 2018-07-24 NOTE — Telephone Encounter (Signed)
Requested medication (s) are due for refill today: yes  Requested medication (s) are on the active medication list: yes  Last refill:  03/20/2018  Future visit scheduled: No  Notes to clinic:  Needs OV    Requested Prescriptions  Pending Prescriptions Disp Refills   omeprazole (PRILOSEC) 20 MG capsule [Pharmacy Med Name: OMEPRAZOLE 20MG  CAPSULES] 60 capsule 0    Sig: TAKE 1 CAPSULE BY MOUTH TWICE DAILY BEFORE A MEAL     Gastroenterology: Proton Pump Inhibitors Passed - 07/24/2018 11:00 AM      Passed - Valid encounter within last 12 months    Recent Outpatient Visits          11 months ago Anxiety and depression   Primary Care at [place], Ines Bloomer, MD   1 year ago Anxiety and depression   Primary Care at [facility], Renette Butters, MD   1 year ago Anxiety and depression   Primary Care at [facility], Renette Butters, MD   1 year ago Urinary frequency   Primary Care at [place], PA-C   1 year ago Generalized abdominal pain   Primary Care at [facility], Ranell Patrick, MD

## 2018-07-25 NOTE — Telephone Encounter (Signed)
Patient needs an OV  For additional refills.

## 2018-08-28 DIAGNOSIS — Z03818 Encounter for observation for suspected exposure to other biological agents ruled out: Secondary | ICD-10-CM | POA: Diagnosis not present

## 2018-08-28 DIAGNOSIS — Z20828 Contact with and (suspected) exposure to other viral communicable diseases: Secondary | ICD-10-CM | POA: Diagnosis not present

## 2018-08-28 DIAGNOSIS — R509 Fever, unspecified: Secondary | ICD-10-CM | POA: Diagnosis not present

## 2018-09-03 NOTE — Telephone Encounter (Signed)
LVM to schedule appt for med refills.

## 2019-06-07 IMAGING — CT CT HEAD W/O CM
4 series · 16 of 47 positions shown, 18 images · non-contrast
Comparison: 06/29/2014

CLINICAL DATA: Generalized weakness and fatigue since [REDACTED].

EXAM:
CT HEAD WITHOUT CONTRAST
TECHNIQUE: Contiguous axial images were obtained from the base of the skull
through the vertex without intravenous contrast.

[Series 2: head wo · axial · 0.47mm/px · z∈[-206,-86]mm · 7 of 33 slices shown, 9 images]
[im 5/33  brain]
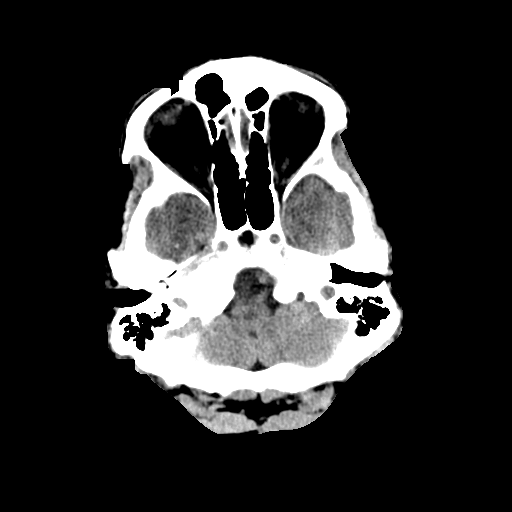
[im 5/33  bone]
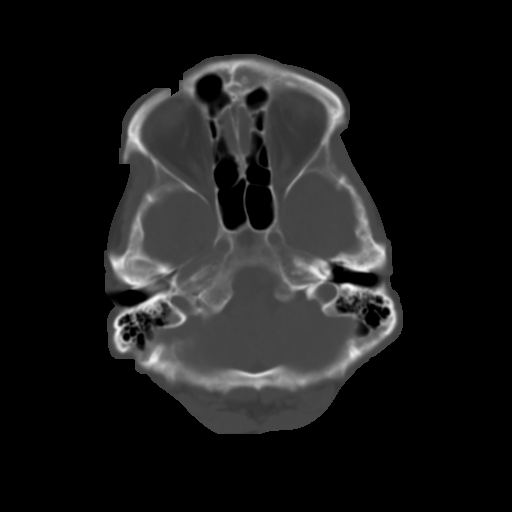
[im 9/33  brain]
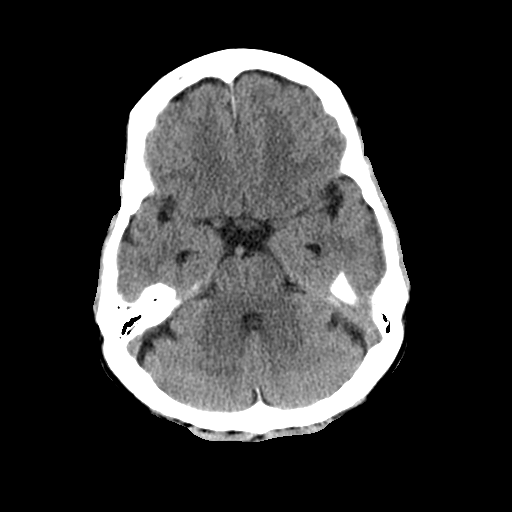
[im 13/33  brain]
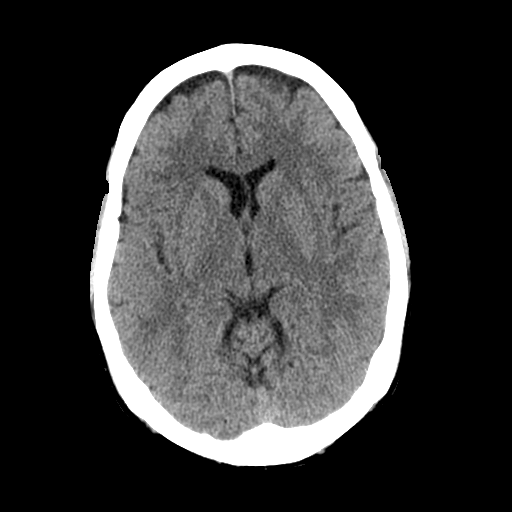
[im 17/33  brain]
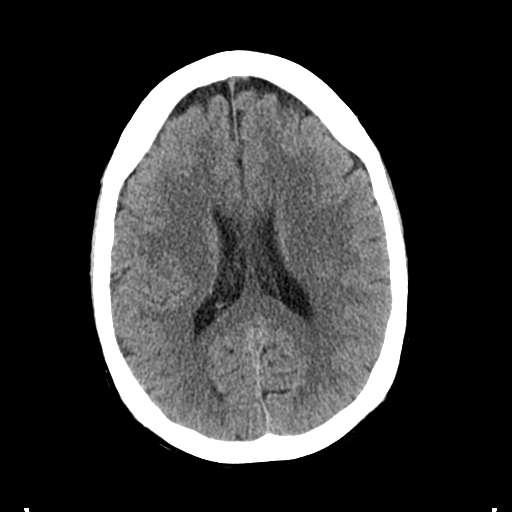
[im 21/33  brain]
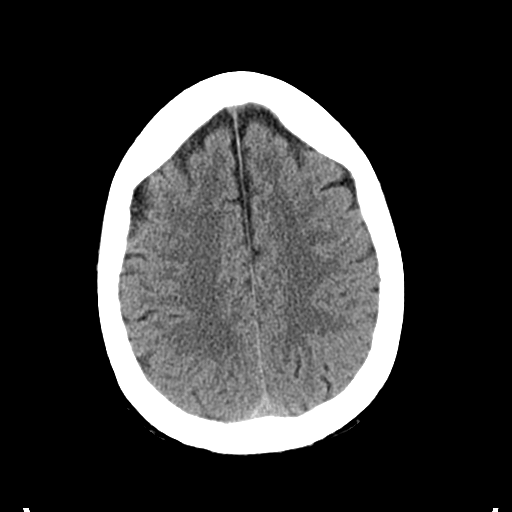
[im 21/33  bone]
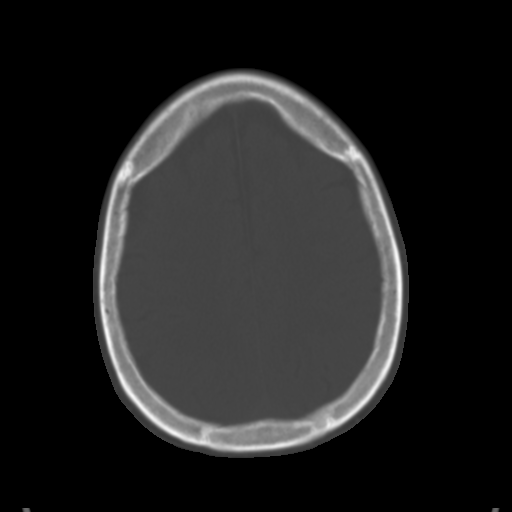
[im 25/33  brain]
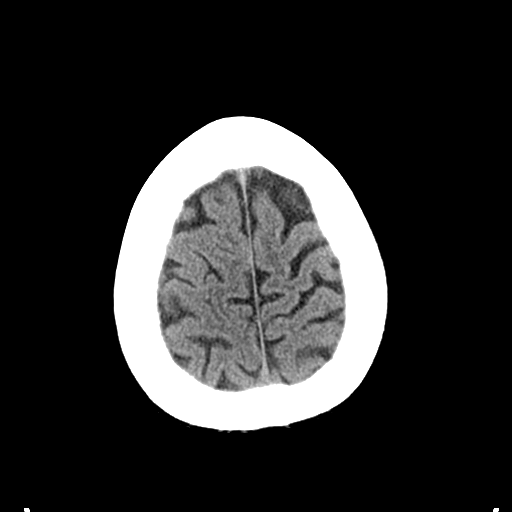
[im 29/33  brain]
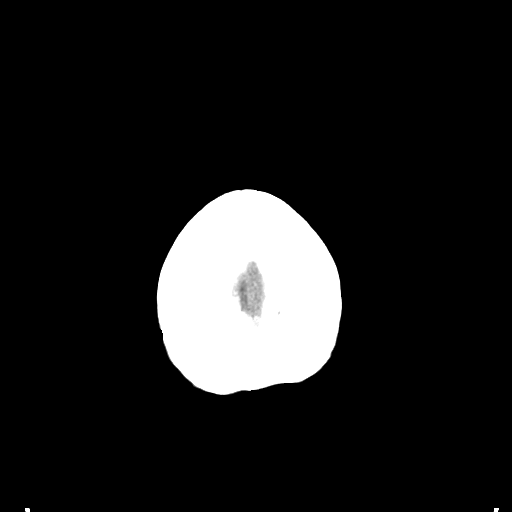

[Series 3: head bone · axial · 0.47mm/px · z∈[-210,-178]mm · 3 of 82 slices shown]
[im 9/82  bone]
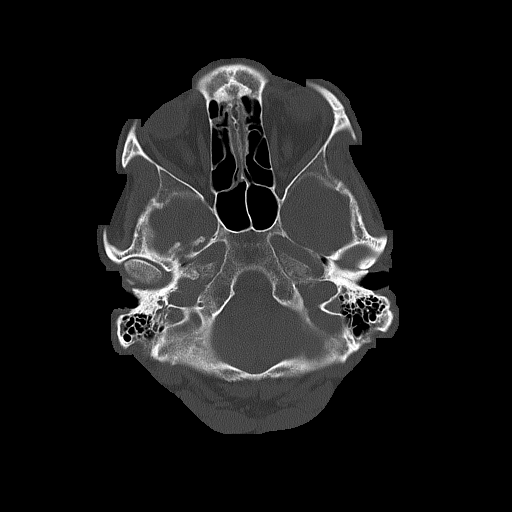
[im 17/82  bone]
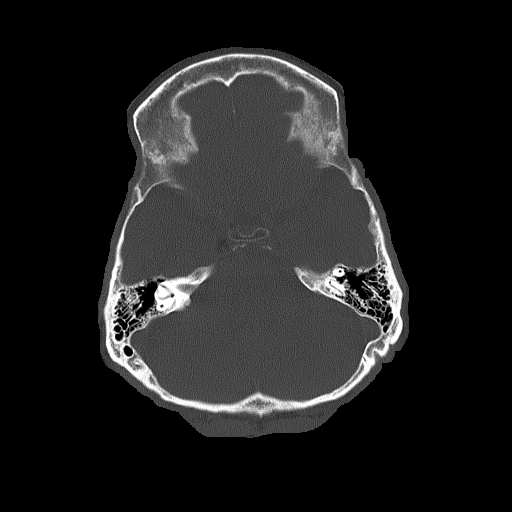
[im 25/82  bone]
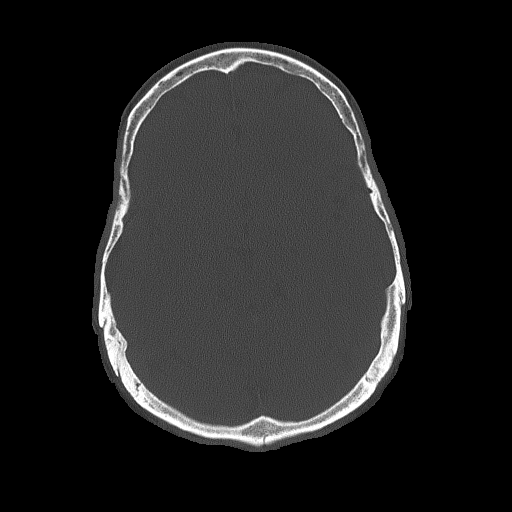

[Series 5: coronal soft tissue · coronal · 0.30mm/px · 3 of 70 slices shown]
[im 24/70  brain]
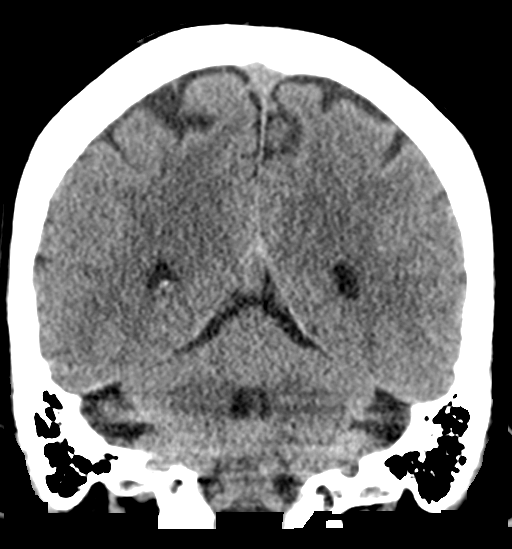
[im 31/70  brain]
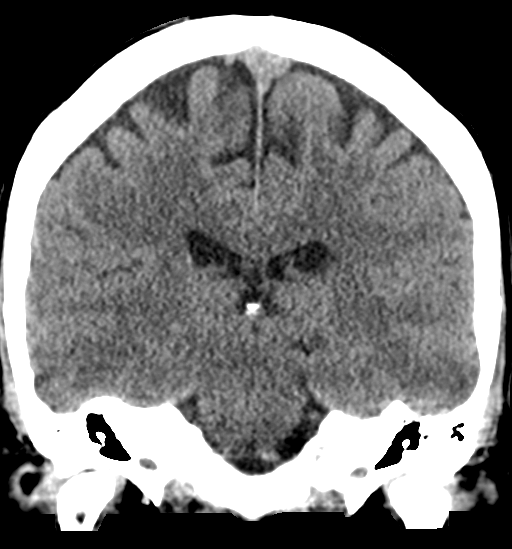
[im 39/70  brain]
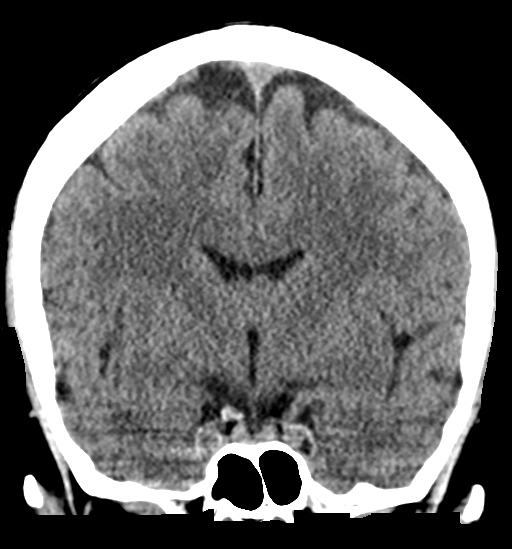

[Series 6: sagittal soft tissue · sagittal · 0.32mm/px · 3 of 51 slices shown]
[im 17/51  brain]
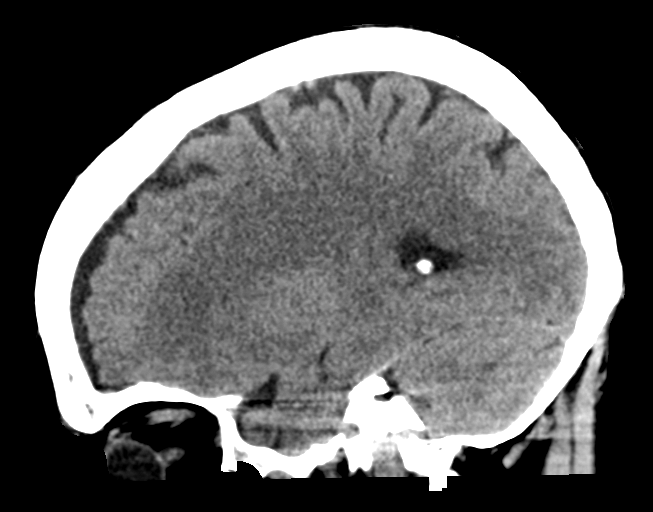
[im 26/51  brain]
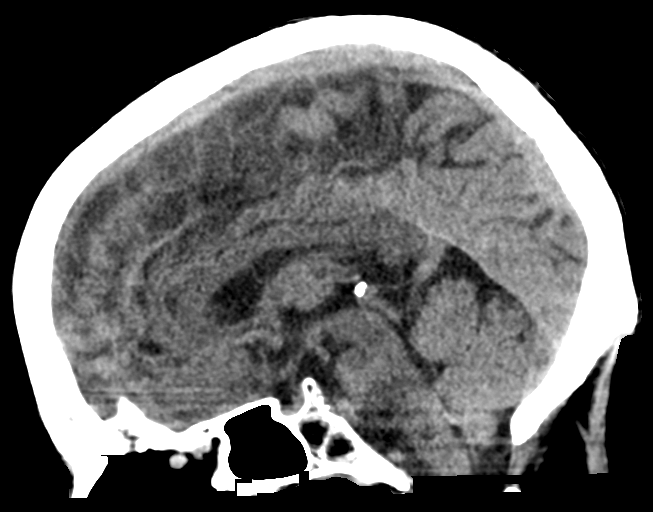
[im 34/51  brain]
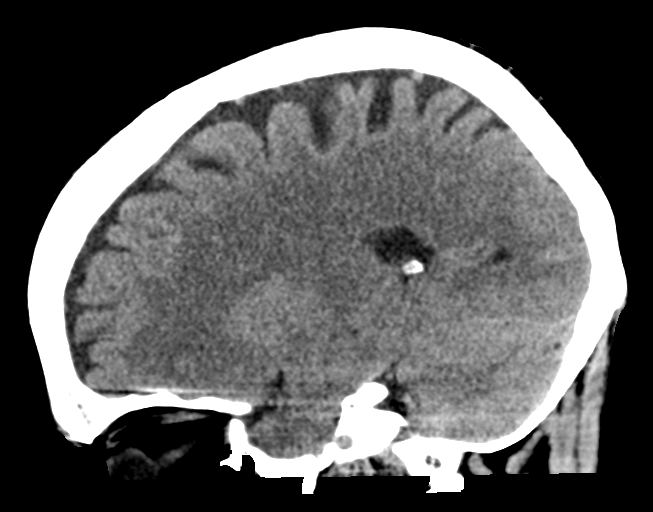

[16 of 47 positions shown; findings below may reference images not displayed]

FINDINGS: BRAIN: Mild superficial atrophy characterized by sulcal prominence,
slightly advanced for age. No intraparenchymal hemorrhage, mass
effect nor midline shift. No acute large vascular territory
infarcts. Grey-white matter distinction is maintained. The basal
ganglia are unremarkable. No abnormal extra-axial fluid collections.
Basal cisterns are not effaced and midline. The brainstem and
cerebellar hemispheres are without acute abnormalities.

VASCULAR: Unremarkable.

SKULL/SOFT TISSUES: No skull fracture. No significant soft tissue
swelling.

ORBITS/SINUSES: The included ocular globes and orbital contents are
normal.The mastoid air cells are clear. The included paranasal
sinuses are well-aerated.

OTHER: None.
IMPRESSION: No acute intracranial abnormality. Mild superficial atrophy advanced
for age.

## 2019-06-10 IMAGING — DX DG ABDOMEN ACUTE W/ 1V CHEST
4 series · 4 of 4 positions shown · non-contrast
Comparison: 06/10/2017.

CLINICAL DATA: Abdominal pain.  Emesis.  History of gastric ulcer.

EXAM:
DG ABDOMEN ACUTE W/ 1V CHEST

[chest pa]
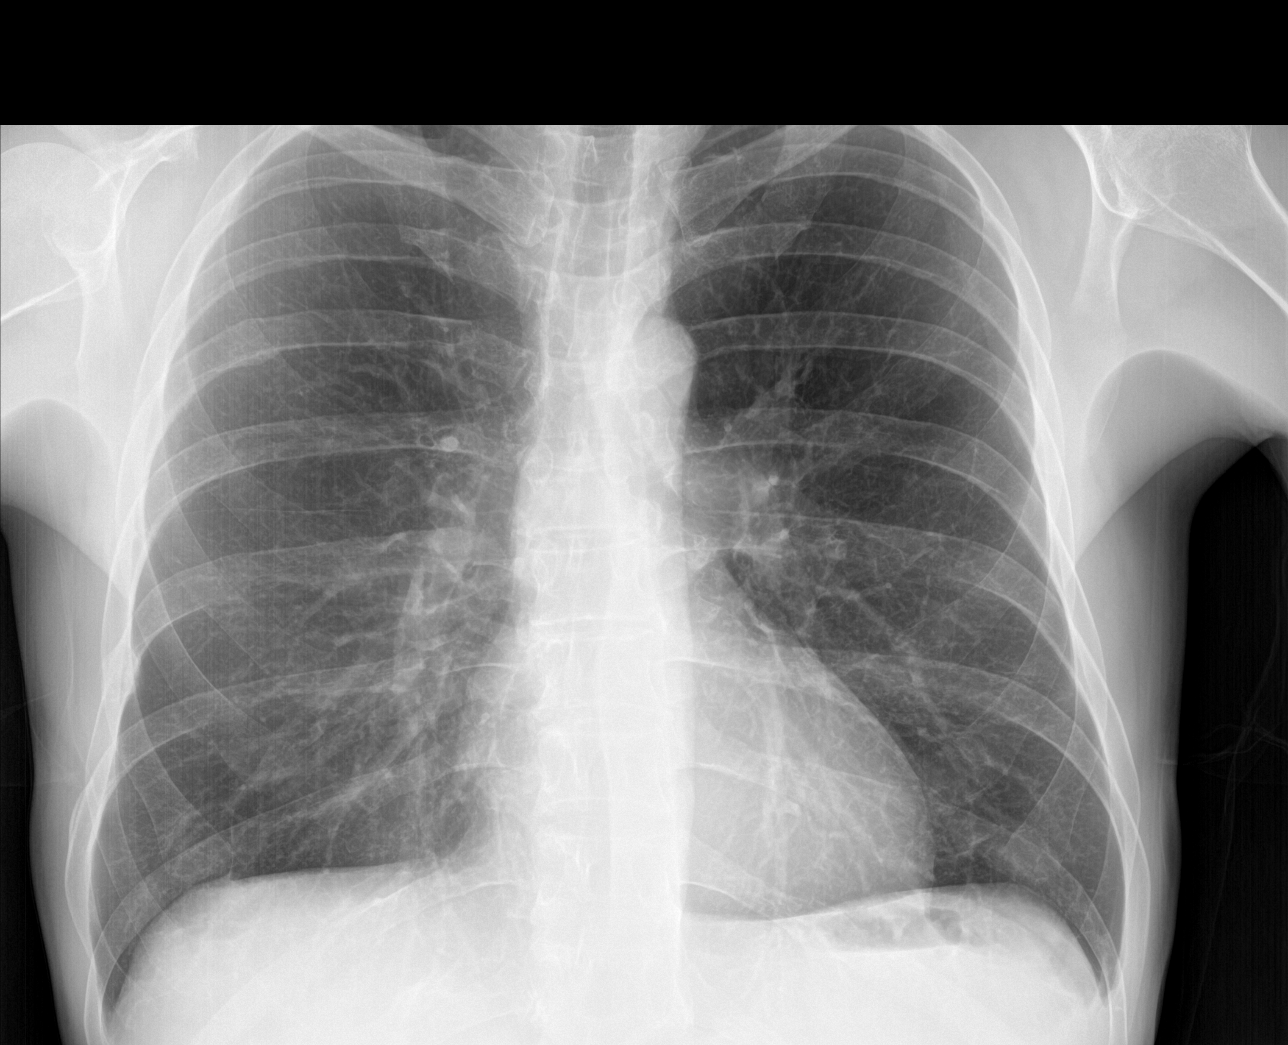

[abdomen erect]
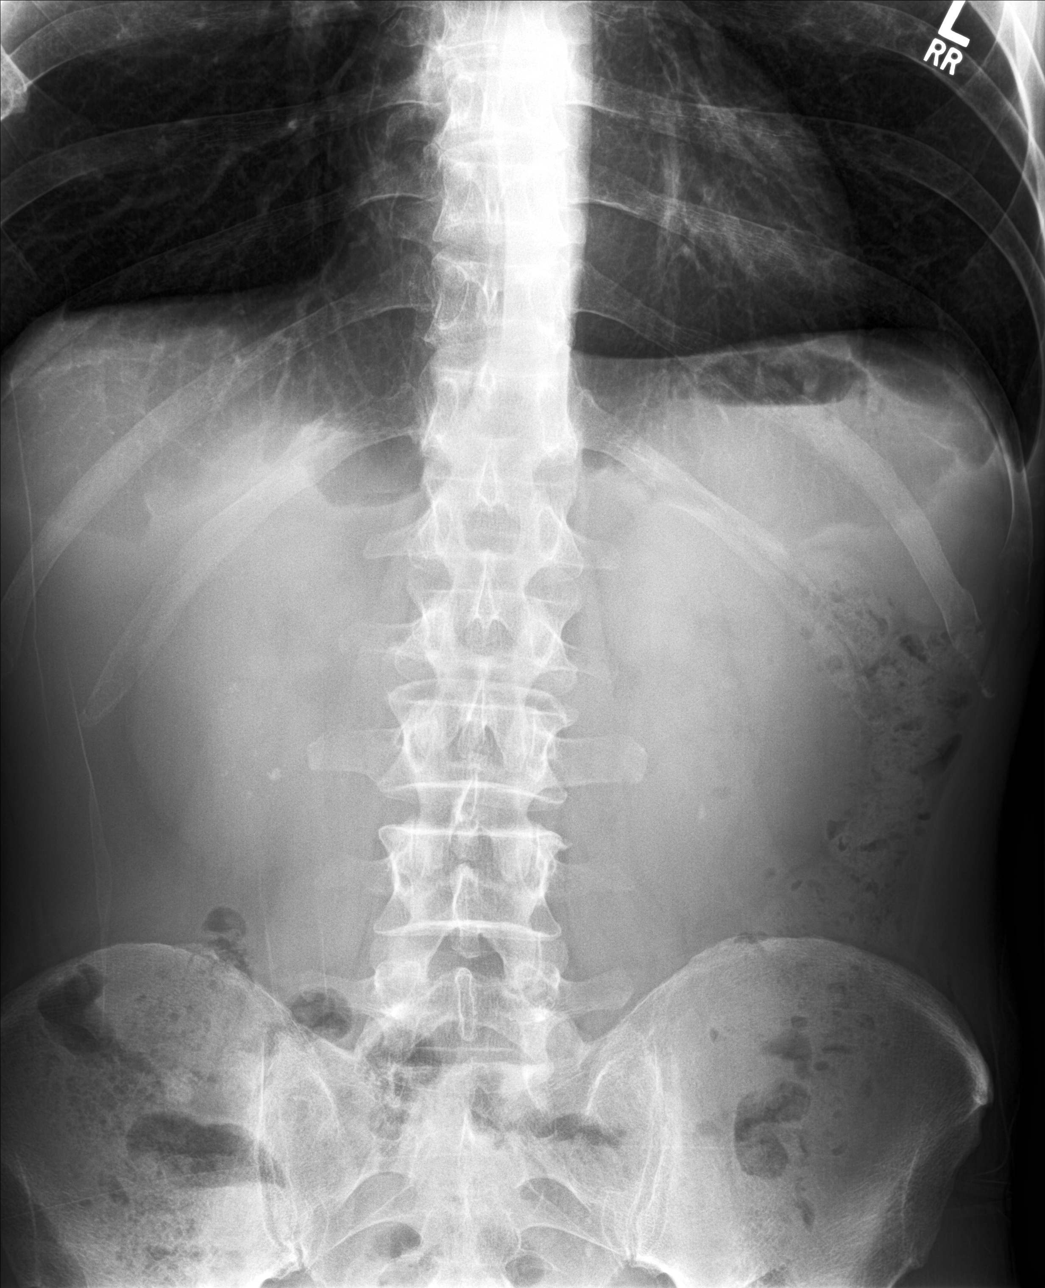

[abdomen supine (1 of 2)]
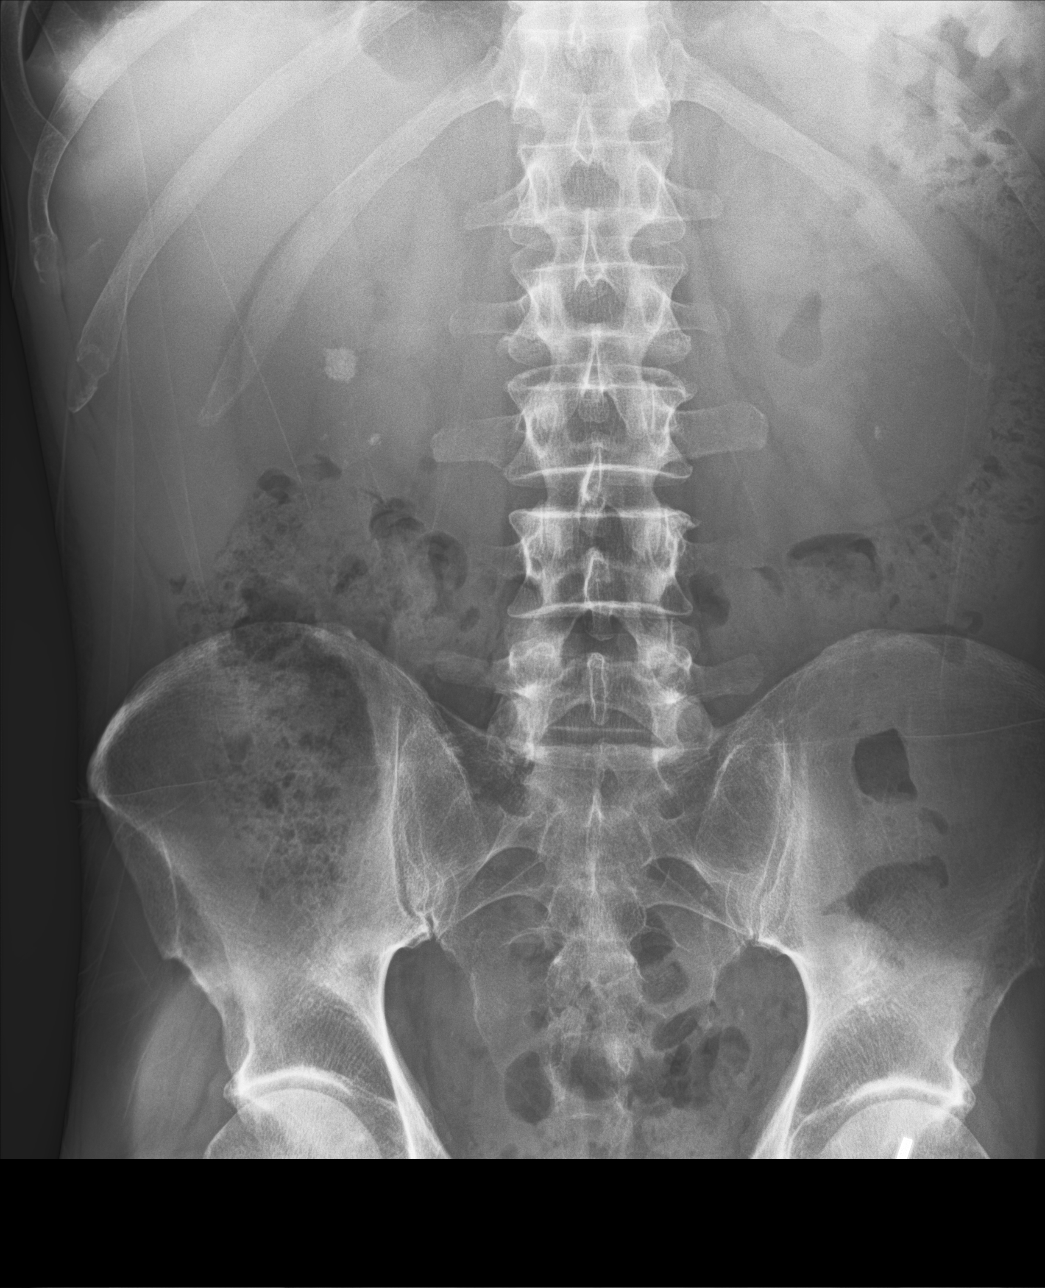

[abdomen supine (2 of 2)]
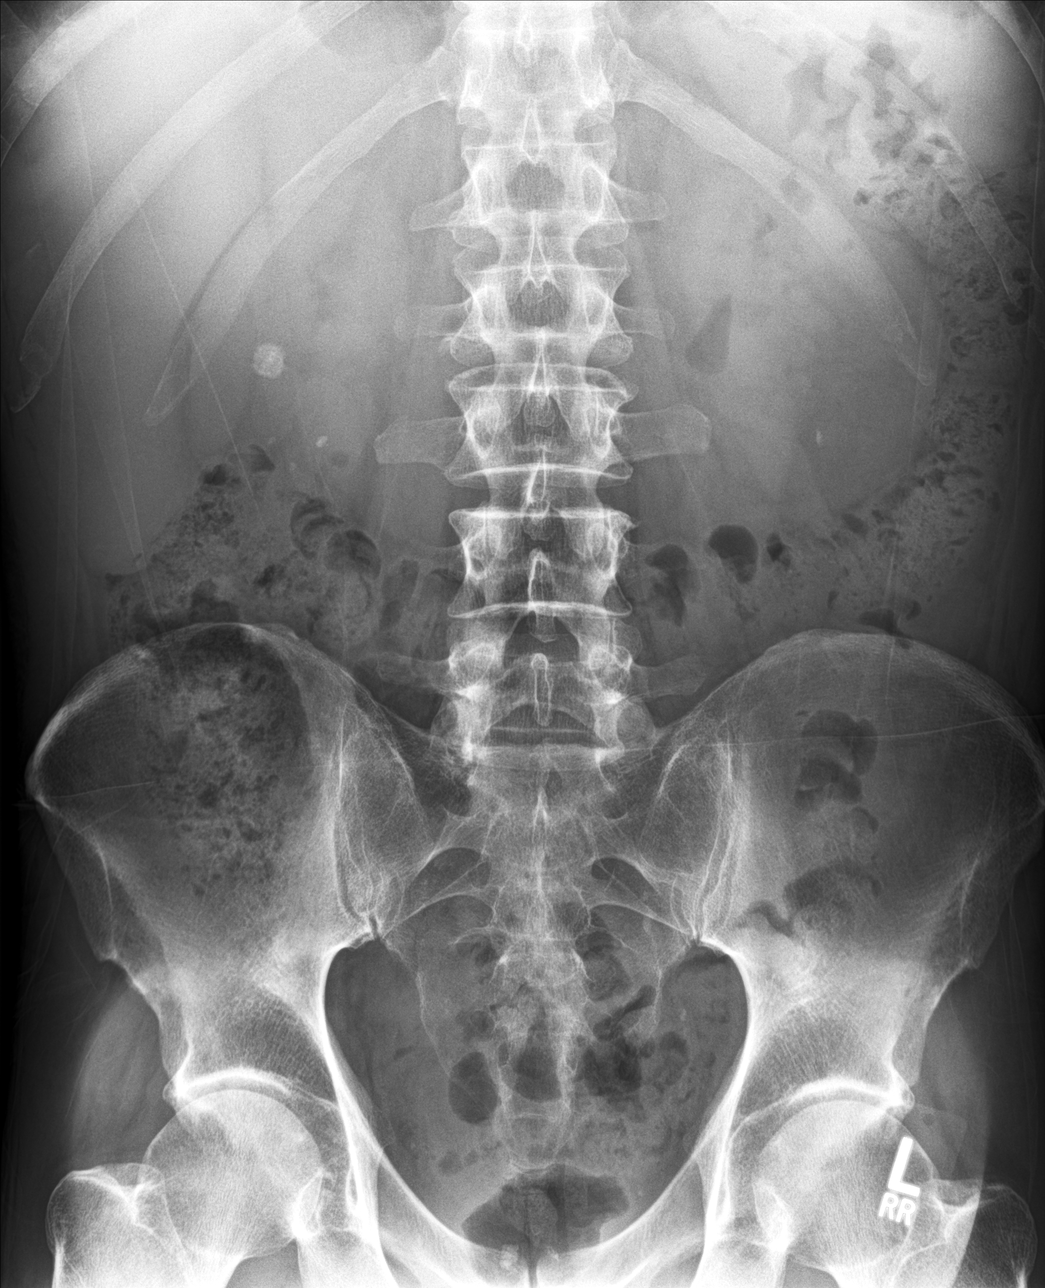

[4 of 4 positions shown; findings below may reference images not displayed]

FINDINGS: Mediastinum hilar structures normal. Lungs are clear. Interim
resolution of bibasilar infiltrates. No pleural effusion or
pneumothorax. Cardiomegaly with normal pulmonary vascularity.

Calcific density noted right upper quadrant consistent with known
gallstone again noted. Known small bilateral renal calyceal stones
again noted. Pelvic calcifications again noted consistent with
phleboliths/vascular calcifications and prostate calcifications. No
bowel distention. Stool noted throughout the colon. No acute bony
abnormality.
IMPRESSION: 1.  Interim resolution of bibasilar infiltrates.

2. Gallstone and bilateral small renal calyceal stones again noted.
No acute intra-abdominal abnormality identified. No bowel
distention.
